# Patient Record
Sex: Female | Born: 1945 | Race: White | Hispanic: No
Health system: Midwestern US, Community
[De-identification: ages and names within clinical notes are randomized; demographics above are authoritative.]

## PROBLEM LIST (undated history)

## (undated) DIAGNOSIS — K219 Gastro-esophageal reflux disease without esophagitis: Secondary | ICD-10-CM

## (undated) DIAGNOSIS — Z0000 Encounter for general adult medical examination without abnormal findings: Secondary | ICD-10-CM

## (undated) DIAGNOSIS — M5441 Lumbago with sciatica, right side: Secondary | ICD-10-CM

## (undated) DIAGNOSIS — M25512 Pain in left shoulder: Secondary | ICD-10-CM

## (undated) DIAGNOSIS — Z79899 Other long term (current) drug therapy: Secondary | ICD-10-CM

## (undated) DIAGNOSIS — G8929 Other chronic pain: Secondary | ICD-10-CM

## (undated) DIAGNOSIS — M5442 Lumbago with sciatica, left side: Secondary | ICD-10-CM

---

## 2016-11-15 ENCOUNTER — Ambulatory Visit: Admit: 2016-11-15 | Discharge: 2016-11-15 | Payer: MEDICARE | Attending: Family | Primary: Family Medicine

## 2016-11-15 DIAGNOSIS — K219 Gastro-esophageal reflux disease without esophagitis: Secondary | ICD-10-CM

## 2016-11-15 MED ORDER — ATORVASTATIN CALCIUM 20 MG PO TABS
20 | ORAL_TABLET | Freq: Every day | ORAL | 3 refills | Status: DC
Start: 2016-11-15 — End: 2017-09-17

## 2016-11-15 MED ORDER — CLONAZEPAM 1 MG PO TABS
1 | ORAL_TABLET | Freq: Every evening | ORAL | 1 refills | Status: DC
Start: 2016-11-15 — End: 2017-06-20

## 2016-11-15 MED ORDER — OMEPRAZOLE 40 MG PO CPDR
40 | ORAL_CAPSULE | Freq: Every day | ORAL | 3 refills | Status: DC
Start: 2016-11-15 — End: 2017-09-17

## 2016-11-15 NOTE — Progress Notes (Signed)
Subjective:      Patient ID: Diane Santos is a 70 y.o. female.    HPI  Refill meds  Had physical in October    Review of Systems  Heart neg  resp neg    Objective:   Physical Exam  General alert and oriented    Assessment:      1. Gastroesophageal reflux disease without esophagitis  omeprazole (PRILOSEC) 40 MG delayed release capsule   2. Pure hypercholesterolemia  atorvastatin (LIPITOR) 20 MG tablet   3. Chronic bilateral low back pain with bilateral sciatica  clonazePAM (KLONOPIN) 1 MG tablet           Plan:      Patient Instructions   Refilled meds; follow up in oct for physical

## 2016-11-15 NOTE — Patient Instructions (Signed)
Refilled meds; follow up in oct for physical

## 2017-05-24 NOTE — Telephone Encounter (Signed)
Pt called to see if we have received all [place]. We do have some previous records scanned into Epic, but pt states that she brought those in , herself. [phone]. Spoke with Dois DavenportSandra who stated that they do not have any release of health information form.   Called back pt. Mailing form to her home, at pt request.

## 2017-06-02 ENCOUNTER — Ambulatory Visit: Admit: 2017-06-02 | Discharge: 2017-06-02 | Payer: MEDICARE | Attending: Family Medicine | Primary: Family Medicine

## 2017-06-02 DIAGNOSIS — J40 Bronchitis, not specified as acute or chronic: Secondary | ICD-10-CM

## 2017-06-02 MED ORDER — DOXYCYCLINE HYCLATE 100 MG PO TABS
100 MG | ORAL_TABLET | Freq: Two times a day (BID) | ORAL | 0 refills | Status: AC
Start: 2017-06-02 — End: 2017-06-12

## 2017-06-02 MED ORDER — BENZONATATE 200 MG PO CAPS
200 MG | ORAL_CAPSULE | Freq: Three times a day (TID) | ORAL | 1 refills | Status: AC | PRN
Start: 2017-06-02 — End: 2017-06-09

## 2017-06-02 NOTE — Progress Notes (Signed)
[facility]    [address]  [address]  [place], [place] [address]  [phone]  ~~~~~~~~~~~~~~~~~~~~~~~~~~~~~~~~~~~~~~~~~~~~~~~~~~~~~~~  ~~~~~~~~~~~~~~~~~~~~~~~~~~~~~~~~~~~~~~~~~~~~~~~~~~~~~~~  ~~~~~~~~~~~~~~~~~~~~~~~~~~~~~~~~~~~~~~~~~~~~~~~~~~~~~~~  Margrett Rud, MD  Family Medicine Office Visit  ~~~~~~~~~~~~~~~~~~~~~~~~~~~~~~~~~~~~~~~~~~~~~~~~~~~~~~~  ~~~~~~~~~~~~~~~~~~~~~~~~~~~~~~~~~~~~~~~~~~~~~~~~~~~~~~~  ~~~~~~~~~~~~~~~~~~~~~~~~~~~~~~~~~~~~~~~~~~~~~~~~~~~~~~~      Chief Complaint   Patient presents with   . Cough     Tickle in throat since Monday , coughing up thick clear phlegm , worse at night when laying down , headache , burning sensation in throat , fatigue , using Advil cold and sinus OTC      =======================    HPI:   General IM:   CONCERNS:  Chief Complaint   Patient presents with   . Cough     Tickle in throat since Monday , coughing up thick clear phlegm , worse at night when laying down , headache , burning sensation in throat , fatigue , using Advil cold and sinus OTC      DAY 6 OF ILLNESS    Some nasal drainage  Cough woke her up at night twice last night  No fevers or chills  No wheezing  Wants to quit smoking but unsure how  Was afraid to  start Chantix previously    Concern about illness due to the fact that she cares for her 7+-year-old mother     Notes that she uses clonazepam nightly from previous doctor in [place]  Due to muscle spasm and back issues  Has not had any back flare since using that    Head computed tomography scan screening of chest about 1 year ago and it was normal  ===================================================      Past data/ past visits reviewed below.    ~~~~~~~~~~~~~~~~~~~~~~~~~~~~~~~~~~~~~~~~~~~~~~~~~~~~~~~  ~~~~~~~~~~~~~~~~~~~~~~~~~~~~~~~~~~~~~~~~~~~~~~~~~~~~~~~  ~~~~~~~~~~~~~~~~~~~~~~~~~~~~~~~~~~~~~~~~~~~~~~~~~~~~~~~  Assessment/ Plan:    ~~~~~~~~~~~~~~~~~~~~~~~~~~~~~~~~~~~~~~~~~~~~~~~~~~~~~~~  ~~~~~~~~~~~~~~~~~~~~~~~~~~~~~~~~~~~~~~~~~~~~~~~~~~~~~~~  ~~~~~~~~~~~~~~~~~~~~~~~~~~~~~~~~~~~~~~~~~~~~~~~~~~~~~~~    Counseled Re: smoking cessation bronchitis antibiotics chronic obstructive pulmonary disease     Diagnosis Orders   1. Bronchitis     2. Tobacco abuse     3. Tobacco abuse counseling         Patient Instructions   For URI: OTC MEDS:      SALINE NASAL SPRAY : USE AS MUCH AS NEEDED TO FLUSH OUT SINUSES   GUIAFENESIN  To loosen mucous  DELSYM to suppress cough :only if needed  Rx Tessalon Perles to help suppress the cough reflex    FILL ANTIBIOTIC : TAKE ALL AND CALL IF NOT IMPROVING  =  Follow-up 3 weeks to repeat lung exam consider chest x-ray  Handouts given:   Bronchitis  Smoking cessation etc.  =   Consider seeing  [facility]  : behavioral health consultant/  in-house psychologist Dr Aldean Jewett   --for stress management / smoking cessation   ==== Unavailable  today (Saturday)   ==  You may schedule a separate appointment or meet with her following a future visit.         Orders Placed This Encounter   Medications   . doxycycline hyclate (VIBRA-TABS) 100 MG tablet     Sig: Take 1 tablet by mouth 2 times daily for 10 days     Dispense:  20 tablet     Refill:  0   . benzonatate (TESSALON) 200 MG capsule     Sig: Take 1 capsule by mouth 3 times daily as needed for Cough     Dispense:  40 capsule     Refill:  1  Return in about 3 weeks (around 06/23/2017).    No orders of the defined types were placed in this encounter.          Problem List     None        Goals     None        ===================================================================     =  There are no discontinued medications.       Medications currently taking:  Outpatient Prescriptions Marked as Taking for the 06/02/17 encounter (Office Visit) with Tod PersiaKristin N Demia Viera, MD   Medication Sig Dispense Refill   . atorvastatin (LIPITOR) 20 MG tablet Take 1 tablet by mouth daily 90 tablet 3   .  clonazePAM (KLONOPIN) 1 MG tablet Take 1 tablet by mouth every evening  For low back pain . 90 tablet 1   . omeprazole (PRILOSEC) 40 MG delayed release capsule Take 1 capsule by mouth daily 90 capsule 3     --        ROS:       SEE HPI  Weight:  Wt Readings from Last 3 Encounters:   06/02/17 130 lb (59 kg)   11/15/16 123 lb (55.8 kg)       Review of Systems        See positives in HPI        Vitals:    06/02/17 1136 06/02/17 1140   BP: (!) 174/80 138/80   Pulse: 63    Temp: 97.7 F (36.5 C)    TempSrc: Oral    SpO2: 99%    Weight: 130 lb (59 kg)    Height: 5\' 4"  (1.626 m)      Body mass index is 22.31 kg/m.  No LMP recorded.    Physical Examination:   GENERAL APPEARANCE: alert and oriented, NAD.   EYES: PERRL, EOMI, no scleral icterus, conjunctiva clear.   NECK/THYROID: visually WNL.   CARDIOVASCULAR: see vitals.   RESPIRATORY: calm.   SKIN: Normal, warm and dry, no rash or skin lesions.   EXTREMITIES: no edema.     NECK/THYROID: supple, non-tender, no lymphadenopathy, no thyromegaly.   CARDIOVASCULAR: regular rate and rhythm, normal S1S2, no murmurs, click or rubs.   RESPIRATORY: no wheezes,  Mild left lower lobe rhonchi,  No rales.     Physical Exam           LAB TESTS REVIEWED ALL PREVIOUS .     No results found for this visit on 06/02/17.    TIME spent : >    20   MINUTES : over 51% total time :    Counselling/coordinating care/ provided discussion as per plan/ patient instructions:  Handouts/ all  reviewed with patient        Portions of note utilize Dragon dictation system:  mild errors may occur.    Assessment and plan at top of note    Tod PersiaKristin N. Cymone Yeske, MD  06/02/17    ~~~~~~~~~~~~~~~~~~~~~~~~~~~~~~~~~~~~~~~~~~~~~~~~~~~~~~~  ~~~~~~~~~~~~~~~~~~~~~~~~~~~~~~~~~~~~~~~~~~~~~~~~~~~~~~~  ~~~~~~~~~~~~~~~~~~~~~~~~~~~~~~~~~~~~~~~~~~~~~~~~~~~~~~~        ===============================================================  CHART REVIEW :   pertinent details  below    ===============================================================  Wt Readings from Last 3 Encounters:   06/02/17 130 lb (59 kg)   11/15/16 123 lb (55.8 kg)       See scanned documents: revd/ pertinent information  copied below    LAST Visit(s) Notes reviewed/ significant notes/ edited version copied below:        [facility]     Provider  Primary Dx   11/15/2016 MichelleTwombly,APRN-CNP Gastroesophageal reflux disease without esophagitis         Health Maintenance   Topic Date Due   . Hepatitis C screen  1946/06/12   . DTaP/Tdap/Td vaccine (1 - Tdap) 12/07/1966   . Lipid screen  12/08/1987   . Breast cancer screen  12/07/1997   . Shingles Vaccine (1 of 2 - 2 Dose Series) 12/07/1997   . Colon cancer screen colonoscopy  12/07/1997   . DEXA (modify frequency per FRAX score)  12/07/2012   . Pneumococcal low/med risk (1 of 2 - PCV13) 12/07/2012   . Flu vaccine (Season Ended) 08/11/2017         Past Medical History:   Diagnosis Date   . Cervical cancer (HCC)    . GERD (gastroesophageal reflux disease)    . Hyperlipidemia    . Lower back pain      Past Surgical History:   Procedure Laterality Date   . CERVICAL DISCECTOMY     . OTHER SURGICAL HISTORY      cone biopsy-cervical ca; pelvic prolapse x2   . ROTATOR CUFF REPAIR Right      Family History   Problem Relation Age of Onset   . Cancer Mother      cervical   . Cancer Sister      esophageal, cervical   . Cancer Other      lung   . Cancer Sister      melanoma     Social History     Social History   . Marital status: Divorced     Spouse name: N/A   . Number of children: N/A   . Years of education: N/A     Social History Main Topics   . Smoking status: Current Every Day Smoker     Packs/day: 1.00   . Smokeless tobacco: Never Used   . Alcohol use None   . Drug use: Unknown   . Sexual activity: Not Asked     Other Topics Concern   . None     Social History Narrative   . None         No Known Allergies      There is no immunization history on  file for this patient.    New meds at discharge:      Medication List          Accurate as of 06/02/17 12:03 PM. If you have any questions, ask your nurse or doctor.               START taking these medications    benzonatate 200 MG capsule  Commonly known as:  TESSALON  Take 1 capsule by mouth 3 times daily as needed for Cough  Started by:  Tod Persia, MD     doxycycline hyclate 100 MG tablet  Commonly known as:  VIBRA-TABS  Take 1 tablet by mouth 2 times daily for 10 days  Started by:  Tod Persia, MD        CONTINUE taking these medications    atorvastatin 20 MG tablet  Commonly known as:  LIPITOR  Take 1 tablet by mouth daily     clonazePAM 1 MG tablet  Commonly known as:  KLONOPIN  Take 1 tablet by mouth every evening  For low back pain .     omeprazole 40 MG delayed release capsule  Commonly known as:  PRILOSEC  Take 1 capsule by mouth daily  Where to Get Your Medications      These medications were sent to [facility] [id number] - [address] [place] - [address] - P [phone] Carmon Ginsberg [phone]  [address], [place] [address]    Phone:  [phone]    benzonatate 200 MG capsule   doxycycline hyclate 100 MG tablet

## 2017-06-02 NOTE — Patient Instructions (Addendum)
For URI: OTC MEDS:      SALINE NASAL SPRAY : USE AS MUCH AS NEEDED TO FLUSH OUT SINUSES   GUIAFENESIN  To loosen mucous  DELSYM to suppress cough :only if needed  Rx Tessalon Perles to help suppress the cough reflex    FILL ANTIBIOTIC : TAKE ALL AND CALL IF NOT IMPROVING  =  Follow-up 3 weeks to repeat lung exam consider chest x-ray  Handouts given:   Bronchitis  Smoking cessation etc.  =   [facility]  [facility]  : [facility]/  in-house psychologist Dr Aldean JewettHrabowy   --for stress management / smoking cessation   ==== Unavailable  today (Saturday)   ==  You may schedule a separate appointment or meet with her following a future visit.

## 2017-06-04 ENCOUNTER — Telehealth

## 2017-06-04 NOTE — Telephone Encounter (Signed)
Same day 06/02/17, NP 11/15/16, pt due for cpe. Please advise pt she is due for cpe and appt re: this medication.

## 2017-06-04 NOTE — Telephone Encounter (Signed)
Left message to call and schedule pe.

## 2017-06-20 ENCOUNTER — Ambulatory Visit: Admit: 2017-06-20 | Discharge: 2017-06-20 | Payer: MEDICARE | Attending: Family | Primary: Family Medicine

## 2017-06-20 DIAGNOSIS — J40 Bronchitis, not specified as acute or chronic: Secondary | ICD-10-CM

## 2017-06-20 MED ORDER — CLONAZEPAM 1 MG PO TABS
1 | ORAL_TABLET | Freq: Every evening | ORAL | 0 refills | Status: DC
Start: 2017-06-20 — End: 2017-09-17

## 2017-06-20 NOTE — Patient Instructions (Addendum)
Zyrtec 1 a day x 2wks at least    Talk with dr Jean Rosenthaljackson about inhaler for daily cough at physical 10/8    Pt given rx for klonopin

## 2017-06-20 NOTE — Progress Notes (Signed)
Subjective:    Patient ID: Diane Santos is a [age] y.o. Gar Pontofemale.    HPI  Saw dr Gerrit Heckkranz 6/23 for bronchitis; took doxycycline and tessalon and saline ns  Doxycyline caused side effect from the sun; took full rx; used aloe and ice  No wheezing anymore; usual cough; no fever; every am coughing up mucus; better with runny nose and stuffy nose and post nasal drip; no ST now  Here to recheck lungs  Has appt with dr j 10/8 for cpe    rf klonopin 1 a day for back pain    Review of Systems  Ms neg except as above  heent neg except as above  resp neg except as above    Objective:   Physical Exam  General alert and oriented  hent-tms pearly gray, pink pharynx, no lymphadenopahty  Heart-rrr, no murmurs  Lungs-cta bilat, no wheezing/crackles/rhonchi noted    Assessment:       Diagnosis Orders   1. Bronchitis      resolving   2. Chronic bilateral low back pain with bilateral sciatica  clonazePAM (KLONOPIN) 1 MG tablet               Plan:      Patient Instructions   Zyrtec 1 a day x 2wks at least    Talk with dr Jean Rosenthaljackson about inhaler for daily cough at physical 10/8    Pt given rx for klonopin

## 2017-09-17 ENCOUNTER — Ambulatory Visit: Admit: 2017-09-17 | Discharge: 2017-09-17 | Payer: MEDICARE | Attending: Family Medicine | Primary: Family Medicine

## 2017-09-17 DIAGNOSIS — Z0000 Encounter for general adult medical examination without abnormal findings: Secondary | ICD-10-CM

## 2017-09-17 MED ORDER — CLONAZEPAM 1 MG PO TABS
1 | ORAL_TABLET | Freq: Every evening | ORAL | 1 refills | Status: DC
Start: 2017-09-17 — End: 2018-04-26

## 2017-09-17 MED ORDER — ZOSTER VAC RECOMB ADJUVANTED 50 MCG/0.5ML IM SUSR
50 MCG/0.5ML | INTRAMUSCULAR | 1 refills | Status: DC
Start: 2017-09-17 — End: 2018-04-26

## 2017-09-17 MED ORDER — TETANUS-DIPHTH-ACELL PERTUSSIS 5-2-15.5 LF-MCG/0.5 IM SUSP
5-2-15.5-0.5 LF-MCG/0.5 | Freq: Once | INTRAMUSCULAR | 0 refills | Status: AC
Start: 2017-09-17 — End: 2017-09-17

## 2017-09-17 MED ORDER — ATORVASTATIN CALCIUM 20 MG PO TABS
20 | ORAL_TABLET | Freq: Every day | ORAL | 4 refills | Status: DC
Start: 2017-09-17 — End: 2018-09-20

## 2017-09-17 MED ORDER — OMEPRAZOLE 40 MG PO CPDR
40 | ORAL_CAPSULE | Freq: Every day | ORAL | 4 refills | Status: DC
Start: 2017-09-17 — End: 2018-09-20

## 2017-09-17 NOTE — Progress Notes (Signed)
Subjective:    Chief Complaint   Patient presents with   . Annual Exam     Would like to be called "Diane Santos", New pt 12/17.  Also sees Derm.  Not fasting, will return for labs.  Reports she had Hep C testing/Dr. Thermon Leyland last year.  Colonoscopy 2015.     . Immunizations     Shingrix, flu, Tdap       New to the area.  Had a colonoscopy and EGD 2016.  Both looked good.  The Hep C was also negative.  Not ready to quit smoking.  Dealing with mother more stressful.  Diane Santos daughter.  Diane Santos's sister.  Lives with Diane Santos and takes care of her.  Chart reviewed.           Review of Systems   Constitutional: Negative for activity change, appetite change and unexpected weight change.   HENT: Negative.    Respiratory: Negative for cough, shortness of breath and wheezing.    Cardiovascular: Negative for leg swelling.   Gastrointestinal: Negative for blood in stool, constipation and diarrhea.   Genitourinary: Negative for dysuria, frequency and hematuria.   Musculoskeletal: Negative for back pain.   Psychiatric/Behavioral: The patient is nervous/anxious.            Social History     Social History   . Marital status: Divorced     Spouse name: N/A   . Number of children: N/A   . Years of education: N/A     Occupational History   . Not on file.     Social History Main Topics   . Smoking status: Current Every Day Smoker     Packs/day: 1.00   . Smokeless tobacco: Never Used   . Alcohol use 8.4 - 12.6 oz/week     14 - 21 Glasses of wine per week   . Drug use: Unknown   . Sexual activity: Not on file     Other Topics Concern   . Not on file     Social History Narrative   . No narrative on file     Past Medical History:   Diagnosis Date   . Cervical cancer (HCC)    . GERD (gastroesophageal reflux disease)    . Hyperlipidemia    . Lower back pain      Past Surgical History:   Procedure Laterality Date   . CERVICAL DISCECTOMY     . OTHER SURGICAL HISTORY      cone biopsy-cervical ca; pelvic prolapse x2   . ROTATOR CUFF REPAIR  Right    . SKIN CANCER EXCISION      BCC     No Known Allergies  Family History   Problem Relation Age of Onset   . Cancer Mother         cervical   . Stroke Mother         Barbaraann Share   . Cancer Sister         esophageal, cervical   . Cancer Other         lung   . Cancer Sister         melanoma       Objective:       Vitals:    09/17/17 0735   BP: 128/76   Pulse: 78   Temp: 98 F (36.7 C)   SpO2: 96%   Weight: 125 lb (56.7 kg)   Height: 5\' 4"  (1.626 m)  Current Outpatient Prescriptions   Medication Sig Dispense Refill   . Tdap (ADACEL) 04-11-14.5 LF-MCG/0.5 injection Inject 0.5 mLs into the muscle once for 1 dose 0.5 mL 0   . zoster recombinant adjuvanted vaccine (SHINGRIX) 50 MCG SUSR injection 50 MCG IM then repeat 2-6 months. 0.5 mL 1   . atorvastatin (LIPITOR) 20 MG tablet Take 1 tablet by mouth daily 90 tablet 4   . clonazePAM (KLONOPIN) 1 MG tablet Take 1 tablet by mouth every evening for 92 days. For low back pain . 90 tablet 1   . omeprazole (PRILOSEC) 40 MG delayed release capsule Take 1 capsule by mouth daily 90 capsule 4   . FINACEA 15 % GEL        No current facility-administered medications for this visit.        Physical Exam   Constitutional: She is oriented to person, place, and time. She appears well-developed and well-nourished.   HENT:   Head: Normocephalic and atraumatic.   Right Ear: External ear normal.   Left Ear: External ear normal.   Mouth/Throat: Oropharynx is clear and moist. No oropharyngeal exudate.   Eyes: Pupils are equal, round, and reactive to light. Conjunctivae and EOM are normal. No scleral icterus.   Neck: Normal range of motion. Neck supple. No JVD present. No thyromegaly present.   Cardiovascular: Normal rate, regular rhythm and normal heart sounds.    No murmur heard.  Pulmonary/Chest: Effort normal and breath sounds normal. She has no wheezes. She exhibits no tenderness.   Abdominal: Soft. Bowel sounds are normal. She exhibits no distension and no mass. There is no  tenderness.   Musculoskeletal: She exhibits no edema.   Lymphadenopathy:     She has no cervical adenopathy.   Neurological: She is alert and oriented to person, place, and time. No cranial nerve deficit.   Skin: Skin is warm. No rash noted.   Psychiatric: She has a normal mood and affect. Her behavior is normal.   Nursing note and vitals reviewed.  Breast:  Normal breast exam bilaterally.  No masses, no nipple discharge, no skin changes, no axillary lymphadenopathy        Assessment:      Diagnosis Orders   1. Routine adult health maintenance  CBC Auto Differential   2. Pure hypercholesterolemia  atorvastatin (LIPITOR) 20 MG tablet    Comprehensive Metabolic Panel    Lipid Panel   3. Chronic bilateral low back pain with bilateral sciatica  clonazePAM (KLONOPIN) 1 MG tablet   4. Gastroesophageal reflux disease without esophagitis  omeprazole (PRILOSEC) 40 MG delayed release capsule   5. Post-menopausal  DEXA Bone Density Axial Skeleton   6. Encounter for screening mammogram for breast cancer  MAM Digital Screen Bilateral [[id number]]   7. Need for prophylactic vaccination against diphtheria-tetanus-pertussis (DTP)  Tdap (ADACEL) 04-11-14.5 LF-MCG/0.5 injection   8. Need for prophylactic vaccination and inoculation against varicella  zoster recombinant adjuvanted vaccine (SHINGRIX) 50 MCG SUSR injection   9. Flu vaccine need  INFLUENZA, HIGH DOSE, 65 YRS +, IM, PF, PREFILL SYR, 0.5ML (FLUZONE HD)         Plan:     Goals     None            Loanne Drilling, MD

## 2017-09-19 ENCOUNTER — Encounter

## 2017-09-19 LAB — CBC WITH AUTO DIFFERENTIAL
Absolute Baso #: 0.1 10*3/uL (ref 0.0–0.2)
Absolute Eos #: 0.1 10*3/uL (ref 0.0–0.5)
Absolute Lymph #: 1.3 10*3/uL (ref 1.0–4.3)
Absolute Mono #: 0.4 10*3/uL (ref 0.0–0.8)
Absolute Neut #: 5.3 10*3/uL (ref 1.8–7.0)
Basophils: 0.9 % (ref 0.0–2.0)
Eosinophils: 0.9 % — ABNORMAL LOW (ref 1.0–6.0)
Granulocytes %: 74 % (ref 40.0–80.0)
Hematocrit: 43.7 % (ref 35.0–47.0)
Hemoglobin: 14.9 g/dL (ref 11.7–16.0)
Lymphocyte %: 17.9 % — ABNORMAL LOW (ref 20.0–40.0)
MCH: 33.6 pg (ref 26.0–34.0)
MCHC: 34.1 % (ref 32.0–36.0)
MCV: 98.5 fL — ABNORMAL HIGH (ref 79.0–98.0)
MPV: 9.7 fL (ref 7.4–10.4)
Monocytes: 6.3 % (ref 2.0–10.0)
Platelets: 188 10*3/uL (ref 140–440)
RBC: 4.43 10*6/uL (ref 3.80–5.20)
RDW: 14.2 % (ref 11.5–14.5)
WBC: 7.1 10*3/uL (ref 3.6–10.7)

## 2017-09-19 LAB — COMPREHENSIVE METABOLIC PANEL
ALT: 31 U/L (ref 13–69)
AST: 30 U/L (ref 15–46)
Albumin,Serum: 4.1 g/dL (ref 3.5–5.0)
Alkaline Phosphatase: 72 U/L (ref 38–126)
Anion Gap: 10 NA
BUN: 13 mg/dL (ref 7–20)
CO2: 25 mmol/L (ref 22–30)
Calcium: 9.6 mg/dL (ref 8.4–10.4)
Chloride: 104 mmol/L (ref 98–107)
Creatinine: 0.76 mg/dL (ref 0.52–1.25)
EGFR IF NonAfrican American: >60 mL/min (ref >60)
Glucose: 105 mg/dL — ABNORMAL HIGH (ref 70–100)
Potassium: 4.9 mmol/L (ref 3.5–5.1)
Sodium: 139 mmol/L (ref 137–145)
Total Bilirubin: 0.7 mg/dL (ref 0.2–1.3)
Total Protein: 6.7 g/dL (ref 6.3–8.2)
eGFR African American: >60 mL/min (ref >60)

## 2017-09-19 LAB — LIPID PANEL
Chol/HDL Ratio: 3 NA
Cholesterol: 171 mg/dL (ref <200)
HDL: 64 mg/dL — ABNORMAL HIGH (ref 40–60)
LDL Cholesterol: 97 mg/dL (ref <100)
Triglycerides: 51 mg/dL (ref <150)

## 2017-10-22 ENCOUNTER — Ambulatory Visit: Primary: Family Medicine

## 2017-11-13 ENCOUNTER — Inpatient Hospital Stay: Admit: 2017-11-13 | Attending: Family Medicine | Primary: Family Medicine

## 2017-11-13 DIAGNOSIS — Z1231 Encounter for screening mammogram for malignant neoplasm of breast: Secondary | ICD-10-CM

## 2017-11-13 NOTE — Other (Unsigned)
Patient Acct Nbr: [id number]   Primary AUTH/CERT:   Primary Insurance Company Name: [employer]  Primary Insurance Plan name: Medicare A  Primary Insurance Group Number:   Primary Insurance Plan Type: Quar[employer] manager[employer]care A  Primary Insurance Policy Number: [id number]    Secondary AUTH/CERT:   Secondary Insurance Company Name: [employer]  Secondary Insurance Plan name: Medicare B  Secondary Insurance Group Number:   Secondary Insurance Plan Type: Journalist, newspaperMcare B  Secondary Insurance Policy Number: [id number]    Tertiary AUTH/CERT:   [employer] Corporationertiary Insurance Company Name: [employer] AARP Supplement  [employer] Br[employer]ertiary Insurance Plan name: [employer] AARP Supplement  [employer]ertiary Insurance Group Number:   [employer] Br[employer]ertiary Insurance Plan Type: [employer]  Tertiary Insurance Policy Number: [id number]

## 2018-02-20 NOTE — Telephone Encounter (Signed)
Spoke with pt advising she can switch pcp's   Pt understands     Transferred her up front to change her PE

## 2018-02-20 NOTE — Telephone Encounter (Signed)
Pt called requesting to switch pcp to dr Gerrit Heckkranz   She normally sees dr Jean RosenthalJackson     Is this ok?

## 2018-02-20 NOTE — Telephone Encounter (Signed)
Ok

## 2018-04-26 ENCOUNTER — Ambulatory Visit: Admit: 2018-04-26 | Discharge: 2018-04-26 | Payer: MEDICARE | Attending: Family Medicine | Primary: Family Medicine

## 2018-04-26 ENCOUNTER — Encounter: Attending: Family | Primary: Family Medicine

## 2018-04-26 DIAGNOSIS — M545 Low back pain, unspecified: Secondary | ICD-10-CM

## 2018-04-26 MED ORDER — CLONAZEPAM 1 MG PO TABS
1 MG | ORAL_TABLET | ORAL | 0 refills | Status: DC
Start: 2018-04-26 — End: 2018-09-20

## 2018-04-26 MED ORDER — AMITRIPTYLINE HCL 10 MG PO TABS
10 MG | ORAL_TABLET | Freq: Every evening | ORAL | 1 refills | Status: DC
Start: 2018-04-26 — End: 2018-09-20

## 2018-04-26 NOTE — Progress Notes (Signed)
[facility]  [address]  [place], [place] [address]  [phone]  ~~~~~~~~~~~~~~~~~~~~~~~~~~~~~~~~~~~~~~~~~~~~~~~~~~~~~~~  ~~~~~~~~~~~~~~~~~~~~~~~~~~~~~~~~~~~~~~~~~~~~~~~~~~~~~~~  ~~~~~~~~~~~~~~~~~~~~~~~~~~~~~~~~~~~~~~~~~~~~~~~~~~~~~~~  Edman Circle, MD  Family Medicine Office Visit  ~~~~~~~~~~~~~~~~~~~~~~~~~~~~~~~~~~~~~~~~~~~~~~~~~~~~~~~  ~~~~~~~~~~~~~~~~~~~~~~~~~~~~~~~~~~~~~~~~~~~~~~~~~~~~~~~  ~~~~~~~~~~~~~~~~~~~~~~~~~~~~~~~~~~~~~~~~~~~~~~~~~~~~~~~      Chief Complaint   Patient presents with   ??? Medication Refill     upcoming pe 09-20-18     ========    Patient Care Team:  Doretha Imus, MD as PCP - General (Family Medicine)  Lorrin Jackson, MD as PCP - Barry Brunner MSSP  ========    HPI:   General IM:   CONCERNS:  Chief Complaint   Patient presents with   ??? Medication Refill     upcoming pe 09-20-18       Originally scheduled to see nurse practitioner but not here today  Prone to herniated disc  Past R sciatica   From ortho in past rx : pain meds  Then to pain mgmt  Other meds tried: approx 5-6 yrs ago  Epidural : helped  ??  High stress at that time: tension was high  \\  qhs med: x yr  Has tried off: ok x 2-3 night  Then after  will have leg cramps  -needs to get up and move and stress  ??  No RLS  ??  Stress is high  Cares for elderly mother  ??  'muscle relaxers don't work'  'never ever'  ??  Does not recall  trying elavil or neurontin  ??  Life is very sendentary      Past data/ past visits reviewed below.  ===========     PHQ Scores 09/17/2017   PHQ2 Score 0   PHQ9 Score 0     Interpretation of Total Score Depression Severity: 1-4 = Minimal depression, 5-9 = Mild depression, 10-14 = Moderate depression, 15-19 = Moderately severe depression, 20-27 = Severe depression    Assessment/ Plan:   ~~~~~~~~~~~~~~~~~~  ~~~~~~~~~~~~~~~~~~  ~~~~~~~~~~~~~~~~~~    Counseled Re: Long discussion about benzodiazepines in the elderly and addiction and tolerance and other modalities for muscle  pain relief muscle spasm release stretching strengthening yoga myofascial release alternative medications  Stress management           Diagnosis Orders   1. Chronic midline low back pain without sciatica     2. Chronic bilateral low back pain with bilateral sciatica  clonazePAM (KLONOPIN) 1 MG tablet   3. Medication management  CBC    Comprehensive Metabolic Panel    Lipid Panel    TSH without Reflex    Vitamin B12    Vitamin D 25 Hydroxy    CK    Magnesium    Phosphorus   4. Situational stress     5. Muscle cramps  CBC    Comprehensive Metabolic Panel    Lipid Panel    TSH without Reflex    Vitamin B12    Vitamin D 25 Hydroxy    CK    Magnesium    Phosphorus   6. Routine adult health maintenance  CBC    Comprehensive Metabolic Panel    Lipid Panel    TSH without Reflex    Vitamin B12    Vitamin D 25 Hydroxy    CK    Magnesium    Phosphorus   7. Pure hypercholesterolemia  Lipid Panel   8. Fatigue, unspecified type  CBC    Comprehensive Metabolic Panel  TSH without Reflex    Vitamin B12    Vitamin D 25 Hydroxy    CK    Magnesium    Phosphorus   9. Vitamin D deficiency  Vitamin D 25 Hydroxy       Patient Instructions   You have been on Klonopin nightly for 5+ years due to muscle spasm  We discussed multiple reasons to get off this medication including aging and metabolism change/ tolerance    Start daily stretching strengthening and possibly yoga  Look into massage and myofascial release  The above things will help you have fewer muscle cramps and spasms    Start Elavil 10 mg at bedtime  --if Completely ineffective after 10+ days okay to increase to 2 tabs nightly     change how you take your Klonopin: [place] this  as needed medication and try half instead of whole tab    Note that the above 2 medications can have additive effects : Especially sedation    Note that as use stop using the Klonopin you can have some rebound effect  Allow your body a chance to adjust    Plan follow-up in 3 months: This may be deferred if  you're able to make the current 90 tablet prescription last until CPE 09/20/18    Get labs in advance of CPE 09/20/2018  "orders in computer" / can get at lab on main floor/ come fasting    FYI we now have available [facility]  : behavioral health consultant/  in-house psychologist Dr Tanna Savoy   --for stress management / anxiety/ depression   ==== You may schedule a separate appointment or meet with her following a future visit    Consider physical therapy or guidance for myofascial release and/or massage  -Let me know if you'd like to do this    Consider finding out past medications so we can know what she tried and failed in the past  -Consider contacting past M.D. or pharmacy    Lipitor can also cause muscle pain try off this medication x 10 days then retry!!  I got 1 more piece of paper,      Orders Placed This Encounter   Medications   ??? clonazePAM (KLONOPIN) 1 MG tablet     Sig: 1/2- 1 tab q.h.s. p.r.n. muscle cramps insomnia ; APPOINTMENT NECESSARY FOR ALL CONTROLLED SUBSTANCE REFILLS     Dispense:  90 tablet     Refill:  0   ??? amitriptyline (ELAVIL) 10 MG tablet     Sig: Take 1 tablet by mouth nightly     Dispense:  90 tablet     Refill:  1         Return in about 3 months (around 07/27/2018).    Orders Placed This Encounter   Procedures   ??? CBC     Standing Status:   Future     Standing Expiration Date:   04/27/2019   ??? Comprehensive Metabolic Panel     Standing Status:   Future     Standing Expiration Date:   04/27/2019   ??? Lipid Panel     Standing Status:   Future     Standing Expiration Date:   04/27/2019     Order Specific Question:   Is Patient Fasting?/# of Hours     Answer:   8   ??? TSH without Reflex     Standing Status:   Future     Standing Expiration Date:   04/27/2019   ???  Vitamin B12     Standing Status:   Future     Standing Expiration Date:   04/27/2019   ??? Vitamin D 25 Hydroxy     Standing Status:   Future     Standing Expiration Date:   04/27/2019   ??? CK     Standing Status:   Future     Standing Expiration  Date:   04/26/2019   ??? Magnesium     Standing Status:   Future     Standing Expiration Date:   04/27/2019   ??? Phosphorus     Standing Status:   Future     Standing Expiration Date:   04/27/2019           Problem List     None        Goals     None        ====================    =  Medications Discontinued During This Encounter   Medication Reason   ??? zoster recombinant adjuvanted vaccine (SHINGRIX) 50 MCG SUSR injection LIST CLEANUP   ??? clonazePAM (KLONOPIN) 1 MG tablet REORDER          Medications currently taking:  Outpatient Medications Marked as Taking for the 04/26/18 encounter (Office Visit) with Doretha Imus, MD   Medication Sig Dispense Refill   ??? clonazePAM (KLONOPIN) 1 MG tablet 1 tab q.h.s.  90 tablet 0   ???   90 tablet 1   ??? atorvastatin (LIPITOR) 20 MG tablet Take 1 tablet by mouth daily 90 tablet 4   ??? omeprazole (PRILOSEC) 40 MG delayed release capsule Take 1 capsule by mouth daily 90 capsule 4   ??? FINACEA 15 % GEL        --      ROS:       SEE HPI  Weight:  Wt Readings from Last 3 Encounters:   04/26/18 134 lb (60.8 kg)   09/17/17 125 lb (56.7 kg)   06/20/17 126 lb (57.2 kg)       Review of Systems         See positives in HPI        Vitals:    04/26/18 1039   BP: (!) 144/82   Pulse: 72   SpO2: 97%   Weight: 134 lb (60.8 kg)     Body mass index is 23 kg/m??.  No LMP recorded. Patient is postmenopausal.    Physical Examination:   GENERAL APPEARANCE: alert and oriented, NAD.   EYES: PERRL, EOMI, no scleral icterus, conjunctiva clear.   NECK/THYROID: visually WNL.   CARDIOVASCULAR: see vitals.   RESPIRATORY: calm.   SKIN: Normal, warm and dry, no rash or skin lesions.   EXTREMITIES: no edema.       Physical Exam           LAB TESTS REVIEWED ALL PREVIOUS .     No results found for this visit on 04/26/18.    TIME spent : >    30   MINUTES : over 51% total time :    Counselling/coordinating care/ provided discussion as per plan/ patient instructions:  Handouts/ all  reviewed with patient      Controlled  Substances Monitoring:     RX Monitoring 04/26/2018   Attestation The Prescription Monitoring Report for this patient was reviewed today.   Chronic Pain Routine Monitoring Possible medication side effects, risk of tolerance/dependence & alternative treatments discussed.   Chronic Pain > 50 MEDD Considered consultation  with a specialist.   Chronic Pain > 80 MEDD Obtained or confirmed a written medication contract was on file.       Portions of note utilize Dragon dictation system:  mild errors may occur.    Assessment and plan at top of note    Doretha Imus, MD  04/26/18    ~~~~~~~~~~~~~~~~~~~~~~~~~~~~~~~~~~~~~~~~~~~~~~~~~~~~~~~  ~~~~~~~~~~~~~~~~~~~~~~~~~~~~~~~~~~~~~~~~~~~~~~~~~~~~~~~  ~~~~~~~~~~~~~~~~~~~~~~~~~~~~~~~~~~~~~~~~~~~~~~~~~~~~~~~        ===============================================================  CHART REVIEW :   pertinent details below/ edited / partial copy/paste data:     **  Component      Latest Ref Rng & Units 09/19/2017 09/19/2017 09/19/2017           9:49 AM  9:49 AM  9:49 AM   WBC      3.6 - 10.7 10*3/uL   7.1   RBC      3.80 - 5.20 10*6/uL   4.43   Hemoglobin Quant      11.7 - 16.0 g/dL   14.9   Hematocrit      35.0 - 47.0 %   43.7   MCV      79.0 - 98.0 fL   98.5 (H)   MCH      26.0 - 34.0 pg   33.6   MCHC      32.0 - 36.0 %   34.1   RDW      11.5 - 14.5 %   14.2   Platelet Count      140 - 440 10*3/uL   188   MPV      7.4 - 10.4 fL   9.7   Granulocytes %      40.0 - 80.0 %   74.0   Lymphocyte %      20.0 - 40.0 %   17.9 (L)   Monocytes      2.0 - 10.0 %   6.3   Eosinophils      1.0 - 6.0 %   0.9 (L)   Basophils      0.0 - 2.0 %   0.9   Absolute Neut #      1.8 - 7.0 10*3/uL   5.3   Absolute Lymph #      1.0 - 4.3 10*3/uL   1.3   Absolute Mono #      0.0 - 0.8 10*3/uL   0.4   Absolute Eos #      0.0 - 0.5 10*3/uL   0.1   Basophils #      0.0 - 0.2 10*3/uL   0.1   Sodium      137 - 145 mmol/L  139    Potassium      3.5 - 5.1 mmol/L  4.9    Chloride      98 - 107 mmol/L  104    CO2      22 -  30 mmol/L  25    Anion Gap      NA  10    Glucose      70 - 100 mg/dL  105 (H)    BUN      7 - 20 mg/dL  13    Creatinine      0.52 - 1.25 mg/dL  0.76    eGFR African American      >60 mL/min  >60.0    EGFR IF NonAfrican American      >60 mL/min  >60.0    Calcium  8.4 - 10.4 mg/dL  9.6    Albumin      3.5 - 5.0 g/dL  4.1    Total Protein      6.3 - 8.2 g/dL  6.7    Bilirubin      0.2 - 1.3 mg/dL  0.7    Alk Phos      38 - 126 U/L  72    ALT      13 - 69 U/L  31    AST      15 - 46 U/L  30    Cholesterol      <200 mg/dL 171     Triglycerides      <150 mg/dL 51     HDL Cholesterol      40 - 60 mg/dL 64 (H)     LDL Cholesterol      <100 mg/dL 97     Chol/HDL Ratio      NA 3         ===============================================================  Wt Readings from Last 3 Encounters:   04/26/18 134 lb (60.8 kg)   09/17/17 125 lb (56.7 kg)   06/20/17 126 lb (57.2 kg)       See scanned documents: revd/ pertinent information  copied below    LAST Visit(s) Notes reviewed/ significant notes/ edited version copied below:     Recent Visits with Me     Primary Dx    06/02/2017 Bronchitis  Patient presents with   ??? Cough   ?? ?? Tickle in throat since Monday , coughing up thick clear phlegm , worse at night when laying down , headache , burning sensation in throat , fatigue , using Advil cold and sinus OTC    ??  DAY 6 OF ILLNESS  ??  Some nasal drainage  Cough woke her up at night twice last night  No fevers or chills  No wheezing  Wants to quit smoking but unsure how  Was afraid to  start Chantix previously  ??  Concern about illness due to the fact that she cares for her 28+-year-old mother   ??  Notes that she uses clonazepam nightly from previous doctor in [place]  Due to muscle spasm and back issues  Has not had any back flare since using that  ??  Head computed tomography scan screening of chest about 1 year ago and it was normal  ===================================================  ??  ??    Assessment/ Plan:    ~~~~~~~~~~~~~~~~~~~~~~~~~~~~~~~~~~~~~~~~~~~~~~~~~~~~~~~  ~~~~~~~~~~~~~~~~~~~~~~~~~~~~~~~~~~~~~~~~~~~~~~~~~~~~~~~  ~~~~~~~~~~~~~~~~~~~~~~~~~~~~~~~~~~~~~~~~~~~~~~~~~~~~~~~  ??  Counseled Re: smoking cessation bronchitis antibiotics chronic obstructive pulmonary disease  ??  ?? Diagnosis Orders   1. Bronchitis  ??   2. Tobacco abuse  ??   3. Tobacco abuse counseling  ??   ??  ??  Patient Instructions   For URI: OTC MEDS:      SALINE NASAL SPRAY : USE AS MUCH AS NEEDED TO FLUSH OUT SINUSES   GUIAFENESIN  To loosen mucous  DELSYM to suppress cough :only if needed  Rx Tessalon Perles to help suppress the cough reflex    FILL ANTIBIOTIC : TAKE ALL AND CALL IF NOT IMPROVING  =  Follow-up 3 weeks to repeat lung exam consider chest x-ray  Handouts given:   Bronchitis  Smoking cessation etc.  =   Consider seeing  [facility]  : behavioral health consultant/  in-house psychologist Dr Tanna Savoy   --for stress management / smoking cessation   ==== Unavailable  today (Saturday)   ==  You may schedule a separate appointment or meet with her following a future visit.   ??  ??  ??  Encounter Medications         Orders Placed This Encounter   Medications   ??? doxycycline hyclate (VIBRA-TABS) 100 MG tablet   ?? ?? Sig: Take 1 tablet by mouth 2 times daily for 10 days   ?? ?? Dispense:  20 tablet   ?? ?? Refill:  0   ??? benzonatate (TESSALON) 200 MG capsule   ?? ?? Sig: Take 1 capsule by mouth 3 times daily as needed for Cough   ?? ?? Dispense:  40 capsule   ?? ?? Refill:  1      ??  ??  ??  Return in about 3 weeks (around 06/23/2017).  ??     Other Visits in Family Medicine     Provider Primary Dx   09/17/2017 Lorrin Jackson, MD Routine adult health maintenance  Chief Complaint   Patient presents with   ??? Annual Exam   ?? ?? Would like to be called "Jerrye Beavers", New pt 12/17.  Also sees Derm.  Not fasting, will return for labs.  Reports she had Hep C testing/Dr. Virgel Bouquet last year.  Colonoscopy 2015.     ??? Immunizations   ?? ?? Shingrix, flu, Tdap   ??  ??  New to the area.       06/20/2017 Ernestene Kiel, APRN - CNP Bronchitis   11/15/2016 Ernestene Kiel, APRN - CNP Gastroesophageal reflux disease without esophagitis         Health Maintenance   Topic Date Due   ??? DTaP/Tdap/Td vaccine (1 - Tdap) 12/07/1966   ??? Shingles Vaccine (1 of 2) 12/07/1997   ??? DEXA (modify frequency per FRAX score)  12/07/2012   ??? Breast cancer screen  11/14/2019   ??? Lipid screen  09/19/2022   ??? Colon cancer screen colonoscopy  04/20/2024   ??? Flu vaccine  Completed   ??? Pneumococcal 65+ years Vaccine  Completed   ??? Hepatitis C screen  Completed         Past Medical History:   Diagnosis Date   ??? Cervical cancer ([place])    ??? GERD (gastroesophageal reflux disease)    ??? Hyperlipidemia    ??? Lower back pain      Past Surgical History:   Procedure Laterality Date   ??? CERVICAL DISCECTOMY     ??? OTHER SURGICAL HISTORY      cone biopsy-cervical ca; pelvic prolapse x2   ??? ROTATOR CUFF REPAIR Right    ??? SKIN CANCER EXCISION      BCC     Family History   Problem Relation Age of Onset   ??? Cancer Mother         cervical   ??? Stroke Mother         TIA's   ??? Cancer Sister         esophageal, cervical   ??? Cancer Other         lung   ??? Cancer Sister         melanoma     Social History     Socioeconomic History   ??? Marital status: Divorced     Spouse name: None   ??? Number of children: None   ??? Years of education: None   ??? Highest education level: None   Occupational History   ??? None   Social Needs   ???  Financial resource strain: None   ??? Food insecurity:     Worry: None     Inability: None   ??? Transportation needs:     Medical: None     Non-medical: None   Tobacco Use   ??? Smoking status: Current Every Day Smoker     Packs/day: 1.00   ??? Smokeless tobacco: Never Used   Substance and Sexual Activity   ??? Alcohol use: Yes     Alcohol/week: 8.4 - 12.6 oz     Types: 14 - 21 Glasses of wine per week   ??? Drug use: None   ??? Sexual activity: None   Lifestyle   ??? Physical activity:     Days per week: None     Minutes per session: None   ??? Stress:  None   Relationships   ??? Social connections:     Talks on phone: None     Gets together: None     Attends religious service: None     Active member of club or organization: None     Attends meetings of clubs or organizations: None     Relationship status: None   ??? Intimate partner violence:     Fear of current or ex partner: None     Emotionally abused: None     Physically abused: None     Forced sexual activity: None   Other Topics Concern   ??? None   Social History Narrative   ??? None         No Known Allergies    Immunization History   Administered Date(s) Administered   ??? Influenza, High Dose (Fluzone 65 yrs and older) 09/17/2017   ??? Pneumococcal 13-valent Conjugate (Prevnar13) 05/31/2015   ??? Pneumococcal Polysaccharide (Pneumovax23) 05/25/2014       New meds at discharge:      Medication List           Accurate as of 04/26/18 11:18 AM. If you have any questions, ask your nurse or doctor.               START taking these medications    amitriptyline 10 MG tablet  Commonly known as:  ELAVIL  Take 1 tablet by mouth nightly  Started by:  Doretha Imus, MD        CHANGE how you take these medications    clonazePAM 1 MG tablet  Commonly known as:  KLONOPIN  1/2- 1 tab q.h.s. p.r.n. muscle cramps insomnia ; APPOINTMENT NECESSARY FOR ALL CONTROLLED SUBSTANCE REFILLS  What changed:    ?? how much to take  ?? how to take this  ?? when to take this  ?? additional instructions  Changed by:  Doretha Imus, MD        CONTINUE taking these medications    atorvastatin 20 MG tablet  Commonly known as:  LIPITOR  Take 1 tablet by mouth daily     FINACEA 15 % Gel  Generic drug:  Azelaic Acid     omeprazole 40 MG delayed release capsule  Commonly known as:  PRILOSEC  Take 1 capsule by mouth daily           Where to Get Your Medications      These medications were sent to [facility], [address]  [place], [place] [address]    Phone:  [phone]    ?? amitriptyline  10 MG tablet     You can get these medications from any pharmacy    Bring a paper prescription for each of these medications  ?? clonazePAM 1 MG tablet

## 2018-04-26 NOTE — Patient Instructions (Addendum)
You have been on Klonopin nightly for 5+ years due to muscle spasm  We discussed multiple reasons to get off this medication including aging and metabolism change/ tolerance    Start daily stretching strengthening and possibly yoga  Look into massage and myofascial release  The above things will help you have fewer muscle cramps and spasms    Start Elavil 10 mg at bedtime  --if Completely ineffective after 10+ days okay to increase to 2 tabs nightly     change how you take your Klonopin: [place] this  as needed medication and try half instead of whole tab    Note that the above 2 medications can have additive effects : Especially sedation    Note that as use stop using the Klonopin you can have some rebound effect  Allow your body a chance to adjust    Plan follow-up in 3 months: This may be deferred if you're able to make the current 90 tablet prescription last until CPE 09/20/18    Get labs in advance of CPE 09/20/2018  "orders in computer" / can get at lab on main floor/ come fasting    FYI we now have available [facility]  : behavioral health consultant/  in-house psychologist Dr Aldean Jewett   --for stress management / anxiety/ depression   ==== You may schedule a separate appointment or meet with her following a future visit    Consider physical therapy or guidance for myofascial release and/or massage  -Let me know if you'd like to do this    Consider finding out past medications so we can know what she tried and failed in the past  -Consider contacting past M.D. or pharmacy    Lipitor can also cause muscle pain try off this medication x 10 days then retry!!  I got 1 more piece of paper,

## 2018-04-26 NOTE — Progress Notes (Signed)
Prone to herniated disc  Past R sciatica   From ortho in past rx : pain meds  Then to pain mgmt  Other meds tried: approx 5-6 yrs ago  Epidural : helped    High stress at that time: tension was high  \  qhs med: x yr  Has tried off: ok x 2-3 night  Then after  will have leg cramps  -needs to get up and move and stress    No RLS    Stress is high  Cares for elderly mother    'muscle relaxers don't work'  'never ever'    Does not recall  trying elavil or neurontin    Life is very sendentary

## 2018-08-02 ENCOUNTER — Encounter: Attending: Family Medicine | Primary: Family Medicine

## 2018-09-16 ENCOUNTER — Encounter

## 2018-09-16 LAB — COMPREHENSIVE METABOLIC PANEL
ALT: 23 U/L (ref 13–69)
AST: 29 U/L (ref 15–46)
Albumin,Serum: 4.1 g/dL (ref 3.5–5.0)
Alkaline Phosphatase: 62 U/L (ref 38–126)
Anion Gap: 5 NA
BUN: 14 mg/dL (ref 7–20)
CO2: 27 mmol/L (ref 22–30)
Calcium: 9.1 mg/dL (ref 8.4–10.4)
Chloride: 104 mmol/L (ref 98–107)
Creatinine: 0.69 mg/dL (ref 0.52–1.25)
Glucose: 105 mg/dL — ABNORMAL HIGH (ref 70–100)
Potassium: 4.3 mmol/L (ref 3.5–5.1)
Sodium: 137 mmol/L (ref 135–145)
Total Bilirubin: 0.6 mg/dL (ref 0.2–1.3)
Total Protein: 6.8 g/dL (ref 6.3–8.2)
eGFR AA: >60 mL/min (ref >60)
eGFR NON-AA: >60 mL/min (ref >60)

## 2018-09-16 LAB — CBC
Hematocrit: 42.2 % (ref 35.0–47.0)
Hemoglobin: 14.4 g/dL (ref 11.7–16.0)
MCH: 34.6 pg — ABNORMAL HIGH (ref 26.0–34.0)
MCHC: 34.1 % (ref 32.0–36.0)
MCV: 101.4 fL — ABNORMAL HIGH (ref 79.0–98.0)
MPV: 8.9 fL (ref 7.4–10.4)
Platelets: 192 10*3/uL (ref 140–440)
RBC: 4.16 10*6/uL (ref 3.80–5.20)
RDW: 13.5 % (ref 11.5–14.5)
WBC: 6.4 10*3/uL (ref 3.6–10.7)

## 2018-09-16 LAB — VITAMIN D 25 HYDROXY: Vit D, 25-Hydroxy: 14 ng/mL — ABNORMAL LOW (ref 30–100)

## 2018-09-16 LAB — LIPID PANEL
Chol/HDL Ratio: 3 NA
Cholesterol: 211 mg/dL — AB (ref <200)
HDL: 66 mg/dL — ABNORMAL HIGH (ref 40–60)
LDL Cholesterol: 119 mg/dL — AB (ref <100)
Triglycerides: 128 mg/dL (ref <150)

## 2018-09-16 LAB — VITAMIN B12: Vitamin B-12: 403 pg/mL (ref 239–931)

## 2018-09-16 LAB — MAGNESIUM: Magnesium: 2.1 mg/dL (ref 1.6–2.3)

## 2018-09-16 LAB — PHOSPHORUS: Phosphorus: 3.8 mg/dL (ref 2.5–4.5)

## 2018-09-16 LAB — TSH: TSH: 2.457 u[IU]/mL (ref 0.465–4.680)

## 2018-09-16 LAB — CK: Total CK: 78 U/L (ref 30–170)

## 2018-09-20 ENCOUNTER — Ambulatory Visit: Admit: 2018-09-20 | Discharge: 2018-09-20 | Payer: MEDICARE | Attending: Family Medicine | Primary: Family Medicine

## 2018-09-20 ENCOUNTER — Encounter: Attending: Family Medicine | Primary: Family Medicine

## 2018-09-20 DIAGNOSIS — Z0000 Encounter for general adult medical examination without abnormal findings: Secondary | ICD-10-CM

## 2018-09-20 LAB — POCT GLYCOSYLATED HEMOGLOBIN (HGB A1C): Hemoglobin A1C: 5 %

## 2018-09-20 MED ORDER — CLONAZEPAM 1 MG PO TABS
1 MG | ORAL_TABLET | ORAL | 0 refills | Status: DC
Start: 2018-09-20 — End: 2019-10-21

## 2018-09-20 MED ORDER — ATORVASTATIN CALCIUM 20 MG PO TABS
20 MG | ORAL_TABLET | Freq: Every day | ORAL | 4 refills | Status: DC
Start: 2018-09-20 — End: 2019-10-21

## 2018-09-20 MED ORDER — ERGOCALCIFEROL 1.25 MG (50000 UT) PO CAPS
1.25 MG (50000 UT) | ORAL_CAPSULE | ORAL | 5 refills | Status: DC
Start: 2018-09-20 — End: 2019-10-21

## 2018-09-20 MED ORDER — ZOSTER VAC RECOMB ADJUVANTED 50 MCG/0.5ML IM SUSR
50 MCG/0.5ML | Freq: Once | INTRAMUSCULAR | 1 refills | Status: AC
Start: 2018-09-20 — End: 2018-09-20

## 2018-09-20 MED ORDER — OMEPRAZOLE 40 MG PO CPDR
40 MG | ORAL_CAPSULE | Freq: Every day | ORAL | 4 refills | Status: DC
Start: 2018-09-20 — End: 2019-10-21

## 2018-09-20 NOTE — Progress Notes (Signed)
~~~~~~~~~~~~~~~~~~~~~~~~~~~~~~~~~~~~~~~~~~~~~~~~~~~~~~~  ~~~~~~~~~~~~~~~~~~~~~~~~~~~~~~~~~~~~~~~~~~~~~~~~~~~~~~~  ~~~~~~~~~~~~~~~~~~~~~~~~~~~~~~~~~~~~~~~~~~~~~~~~~~~~~~~  [facility]    [address]  [address]  [place], [place] [address]  [phone]  ~~~~~~~~~~~~~~~~~~~~~~~~~~~~~~~~~~~~~~~~~~~~~~~~~~~~~~~  ~~~~~~~~~~~~~~~~~~~~~~~~~~~~~~~~~~~~~~~~~~~~~~~~~~~~~~~  ~~~~~~~~~~~~~~~~~~~~~~~~~~~~~~~~~~~~~~~~~~~~~~~~~~~~~~~  Margrett Rud, MD  Family Medicine Office Visit  ~~~~~~~~~~~~~~~~~~~~~~~~~~~~~~~~~~~~~~~~~~~~~~~~~~~~~~~  ~~~~~~~~~~~~~~~~~~~~~~~~~~~~~~~~~~~~~~~~~~~~~~~~~~~~~~~  ~~~~~~~~~~~~~~~~~~~~~~~~~~~~~~~~~~~~~~~~~~~~~~~~~~~~~~~      Chief Complaint   Patient presents with   ??? Medicare AWV     pt requesting flu vaccine     ========    Patient Care Team:  Tod Persia, MD as PCP - General (Family Medicine)  ========    HPI:   General IM:   CONCERNS:  Chief Complaint   Patient presents with   ??? Medicare AWV     pt requesting flu vaccine       Still very stressed with issues with caring for aging mother who lives in  She is worsening  Has limited time for herself  Has not really started doing stretching  Try Elavil times when she felt very out of it    Reviewed labs in detail    Reviewed past Pap smear history in detail  Recent labs reviewed of significance vitamin D 14 glucose 105  Lipid panel [id number]    Past data/ past visits reviewed below.  ===========   *past cervical IO:NGEX biopsy ~1980S;  ; past inadequate Paps w gyn x 5; Vag hyst (ovaries intact) due to prolapase / neg HPV not done / next pap due now: pt requests defer: plan 09/2019  ===========  PHQ Scores 09/20/2018 09/17/2017   PHQ2 Score 0 0   PHQ9 Score 0 0     Interpretation of Total Score Depression Severity: 1-4 = Minimal depression, 5-9 = Mild depression, 10-14 = Moderate depression, 15-19 = Moderately severe depression, 20-27 = Severe depression    Assessment/ Plan:    ~~~~~~~~~~~~~~~~~~  ~~~~~~~~~~~~~~~~~~  ~~~~~~~~~~~~~~~~~~    Counseled Re: Issues relating to Pap smear stretching strengthening muscles lab results vitamin D deficiency osteoporosis medication options     Diagnosis Orders   1. Routine general medical examination at a health care facility     2. Pure hypercholesterolemia  atorvastatin (LIPITOR) 20 MG tablet    Lipid Panel    ALT   3. Gastroesophageal reflux disease without esophagitis  omeprazole (PRILOSEC) 40 MG delayed release capsule   4. Chronic bilateral low back pain with bilateral sciatica  clonazePAM (KLONOPIN) 1 MG tablet   5. Screening for breast cancer  MAM DIGITAL SCREEN W CAD BILATERAL   6. Screening for osteoporosis  DEXA Bone Density Axial Skeleton   7. Postmenopausal bone loss  DEXA Bone Density Axial Skeleton   8. Encounter for screening mammogram for malignant neoplasm of breast   MAM DIGITAL SCREEN W CAD BILATERAL   9. Irregular heart rhythm  EKG 12 Lead   10. Restless leg syndrome     11. Muscle cramps  clonazePAM (KLONOPIN) 1 MG tablet   12. Medication management  Vitamin D 25 Hydroxy    Lipid Panel    ALT    Hemoglobin A1C   13. Vitamin D deficiency  Vitamin D 25 Hydroxy   14. IFG (impaired fasting glucose)  Hemoglobin A1C    POCT glycosylated hemoglobin (Hb A1C)       Patient instructions:   Patient Instructions      Start vitamin D prescription: 3 times a week ??2 months then once weekly indefinitely    Fasting labs:  Done previously    Due to medications:  Fasting labs in 6 months/ optional appointment to discuss/"orders in computer" / can get at lab on main floor    Mammograms :yearly : Order placed for DEC   Bone density test : ordered /  :you would like to defer this for now: prior to next appointment so we can review    Continue efforts for muscle health  Increase stretching    Decrease alcohol use : as this can dehydrate you and increase leg cramps  This also increases your risk for osteoporosis    Rx Klonapin: continue efforts to wean  this  Rx #90 : try to make this labs 6-12 m    Recommend vaginal pap: due to past abn: plan for this next yr per your request  =======GENERAL INFO========================================  Move!  Be active!  Exercise!  Exercise should include: Aerobic, strength, flexibility/stretching.  Flexibility and stretching can include massage and gentle stretching and myofascial release modalities.    Monitor and limit "screen time"     Consider a once daily multivitamin  Additional supplements as needed:  CALCIUM recommend at least  1500 MG/D  VITAMIN D 2000-3000 U / D   FISH OIL 2-4GM/D (especially if heart risk or high cholesterol)     Vaccine update:   Immunization History   Administered Date(s) Administered   ??? Influenza, High Dose (Fluzone 71 yrs and older) 09/17/2017   ??? Pneumococcal Conjugate 13-valent (Prevnar13) 05/31/2015   ??? Pneumococcal Polysaccharide (Pneumovax23) 05/25/2014   ??? Tdap (Boostrix, Adacel) 11/23/2017        -Tetanus BOOSTER DUE 10 YEARS AFTER last booster (Td or Tdap) OR IF DIRTY CUT AND >71YRS SINCE  Last booster     NEW shingle vaccine  AGE 71 + :  Shingrix / series of 2 vaccines that give 90+ % protection  -Better than past Zostavax shingles vaccine  --- if needed : Rx will be given:   verify insurance coverage and where to get Pharmacy or MD office  INFLUENZA VACCINE EVERY FALL/  COLONOSCOPY starting age 71- 27 or sooner if family history    SET GOALS! DAILY! WEEKLY! AND BEYOND  -START YOUR BUCKET LIST!  FIND YOUR HAPPY! YOUR BLISS! YOUR PASSION!  HOW CAN WE HELP YOU BE HEALTHIER TO ACHIEVE YOUR GOALS??  HEALTHY DIET!! ENCOURAGE LOW FAT/LOW CHOLESTEROL!  FRESH FRUITS/ VEGGIES! AVOID PROCESSED FOODS!  COOK FOR YOURSELF!!.    FYI we now have available [facility]  : behavioral health consultant/  in-house psychologist Dr Aldean Jewett   --for stress management / anxiety/ depression or other psychological health needs  ==== You may schedule a separate appointment or meet with her following a future visit.      Handouts given:   Wellness    fall prevention  Heart healthy diet  Exercise general  vitamin D  Prediabetes    Today's and previously stated goals:   Goals    None         Personalized Preventive Plan for Aviyanna Colbaugh - 09/20/2018  Medicare offers a range of preventive health benefits. Some of the tests and screenings are paid in full while other may be subject to a deductible, co-insurance, and/or copay.    Some of these benefits include a comprehensive review of your medical history including lifestyle, illnesses that may run in your family, and various assessments and screenings as appropriate.    After reviewing your medical record and screening and assessments performed today your provider may have ordered immunizations, labs, imaging, and/or referrals for you.  A list of these orders (if applicable) as well as your Preventive Care list are included within your After Visit Summary for your review.    Other Preventive Recommendations:    ?? A preventive eye exam performed by an eye specialist is recommended every 1-2 years to screen for glaucoma; cataracts, macular degeneration, and other eye disorders.  ?? A preventive dental visit is recommended every 6 months.  ?? Try to get at least 150 minutes of exercise per week or 10,000 steps per day on a pedometer .  ?? Order or download the FREE "Exercise & Physical Activity: Your Everyday Guide" from [employer]. Call 707-804-3399 or search [employer] online.  ?? You need 1200-1500 mg of calcium and 1000-2000 IU of vitamin D per day. It is possible to meet your calcium requirement with diet alone, but a vitamin D supplement is usually necessary to meet this goal.  ?? When exposed to the sun, use a sunscreen that protects against both UVA and UVB radiation with an SPF of 30 or greater. Reapply every 2 to 3 hours or after sweating, drying off with a towel, or swimming.  ?? Always wear a seat belt when traveling in a car. Always  wear a helmet when riding a bicycle or motorcycle.      Orders Placed This Encounter   Medications   ??? atorvastatin (LIPITOR) 20 MG tablet     Sig: Take 1 tablet by mouth daily     Dispense:  90 tablet     Refill:  4   ??? omeprazole (PRILOSEC) 40 MG delayed release capsule     Sig: Take 1 capsule by mouth daily     Dispense:  90 capsule     Refill:  4   ??? clonazePAM (KLONOPIN) 1 MG tablet     Sig: 1/2- 1 tab q.h.s. p.r.n. muscle cramps insomnia ; APPOINTMENT NECESSARY FOR ALL CONTROLLED SUBSTANCE REFILLS     Dispense:  90 tablet     Refill:  0   ??? zoster recombinant adjuvanted vaccine (SHINGRIX) 50 MCG/0.5ML SUSR injection     Sig: Inject 0.5 mLs into the muscle once for 1 dose second dose: Usually 2-6 months after first     Dispense:  0.5 mL     Refill:  1   ??? ergocalciferol (ERGOCALCIFEROL) 50000 units capsule     Sig: 1 capsule Monday Wednesday Friday x 2 months then 1 cap weekly     Dispense:  16 capsule     Refill:  5         Return in 6 months (on 03/22/2019) for 6 m fup/ Medicare Annual Wellness Visit in 1 year.    Orders Placed This Encounter   Procedures   ??? MAM DIGITAL SCREEN W CAD BILATERAL     Standing Status:   Future     Standing Expiration Date:   11/21/2019     Order Specific Question:   Reason for exam:     Answer:   screening   ??? DEXA Bone Density Axial Skeleton     Standing Status:   Future     Standing Expiration Date:   03/21/2020   ??? INFLUENZA, HIGH DOSE, 65 YRS +, IM, PF, PREFILL SYR, 0.5ML (FLUZONE HD)   ??? Vitamin D 25 Hydroxy     Standing Status:   Future     Standing Expiration Date:   09/21/2019   ??? Lipid Panel  Standing Status:   Future     Standing Expiration Date:   09/20/2019     Order Specific Question:   Is Patient Fasting?/# of Hours     Answer:   yes/ 8   ??? ALT     Standing Status:   Future     Standing Expiration Date:   09/21/2019   ??? Hemoglobin A1C     Standing Status:   Future     Standing Expiration Date:   09/21/2019   ??? POCT glycosylated hemoglobin (Hb A1C)   ??? EKG 12 Lead      Order Specific Question:   Reason for Exam?     Answer:   Irregular heart rate           Problem List     None        Goals    None       ====================    =  Medications Discontinued During This Encounter   Medication Reason   ??? amitriptyline (ELAVIL) 10 MG tablet LIST CLEANUP   ??? atorvastatin (LIPITOR) 20 MG tablet REORDER   ??? omeprazole (PRILOSEC) 40 MG delayed release capsule REORDER   ??? clonazePAM (KLONOPIN) 1 MG tablet REORDER       **Medications currently taking:  Outpatient Medications Marked as Taking for the 09/20/18 encounter (Office Visit) with Tod Persia, MD   Medication Sig Dispense Refill   ??? clonazePAM (KLONOPIN) 1 MG tablet 1/2- 1 tab q.h.s. p.r.n. muscle cramps insomnia ; APPOINTMENT NECESSARY FOR ALL CONTROLLED SUBSTANCE REFILLS 90 tablet 0   ??? atorvastatin (LIPITOR) 20 MG tablet Take 1 tablet by mouth daily 90 tablet 4   ??? omeprazole (PRILOSEC) 40 MG delayed release capsule Take 1 capsule by mouth daily 90 capsule 4   ??? FINACEA 15 % GEL        --      ROS:       SEE HPI  Weight:  Wt Readings from Last 3 Encounters:   09/20/18 135 lb (61.2 kg)   04/26/18 134 lb (60.8 kg)   09/17/17 125 lb (56.7 kg)       Review of Systems     No current vaginal or pelvic complaints  Does breast self-exam on a rare occasion  Wt Readings from Last 3 Encounters:   09/20/18 135 lb (61.2 kg)   04/26/18 134 lb (60.8 kg)   09/17/17 125 lb (56.7 kg)     See positives in HPI        Vitals:    09/20/18 1039   BP: 138/84   Pulse: 78   SpO2: 100%   Weight: 135 lb (61.2 kg)   Height: 5' 4.02" (1.626 m)     Body mass index is 23.16 kg/m??.  No LMP recorded. Patient is postmenopausal.    Physical Examination:   GENERAL APPEARANCE: alert and oriented, NAD.   EYES: PERRL, EOMI, no scleral icterus, conjunctiva clear.   NECK/THYROID: visually WNL.   CARDIOVASCULAR: see vitals.   RESPIRATORY: calm.   SKIN: Normal, warm and dry, no rash or skin lesions.   EXTREMITIES: no edema.     NECK/THYROID: supple, non-tender, no  lymphadenopathy, no thyromegaly.   CARDIOVASCULAR: Irregularly irregular  Electrocardiogram showing sinus arrhythmia     normal S1S2, no murmurs, click or rubs.   RESPIRATORY: CTA: no wheezes, rhonchi, rales.     Physical Exam    TM: flat pearly gray w normal light reflex / no fluid/ no  erythema  OP wnl / normal tonsils/ no erythema/ no exudate/ no petechia    CAROTID: NO BRUIT/ auscultated bilaterally  ABDOMEN: soft/ no guarding/ no rebound/ no mass  Nontender no HSM  Genitourinary - Female:   Refuses  :   BREASTS: no lumps palpated bilaterally, no nipple discharge, no skin changes; INSTRUCTED IN BSE/ENCOURAGED TO PERFORM MONTHLY.         LAB TESTS REVIEWED ALL PREVIOUS .     Results for POC orders placed in visit on 09/20/18   POCT glycosylated hemoglobin (Hb A1C)   Result Value Ref Range    Hemoglobin A1C 5.0 %       TIME spent : >    30    MINUTES : over 51% total time :    Counselling/coordinating care/ provided discussion as per plan/ patient instructions:  Handouts/ all  reviewed with patient    Controlled Substance Monitoring : oarrs revd today IF new patient OR controlled substance prescribed    Controlled Substance Monitoring:    Acute and Chronic Pain Monitoring:   RX Monitoring 04/26/2018   Attestation The Prescription Monitoring Report for this patient was reviewed today.   Periodic Controlled Substance Monitoring Possible medication side effects, risk of tolerance/dependence & alternative treatments discussed.   Chronic Pain > 50 MEDD Considered consultation with a specialist.   Chronic Pain > 80 MEDD Obtained or confirmed a written medication contract was on file.           Portions of note utilize Dragon dictation system:  mild errors may occur.    Assessment and plan at top of note    Tod Persia,  MD  09/30/18    ~~~~~~~~~~~~~~~~~~~~~~~~~~~~~~~~~~~~~~~~~~~~~~~~~~~~~~~  ~~~~~~~~~~~~~~~~~~~~~~~~~~~~~~~~~~~~~~~~~~~~~~~~~~~~~~~  ~~~~~~~~~~~~~~~~~~~~~~~~~~~~~~~~~~~~~~~~~~~~~~~~~~~~~~~        ===============================================================  CHART REVIEW :   pertinent details below/ edited / partial copy/paste data:          ===============================================================    See scanned documents: revd/ pertinent information  copied below    ===============================================================    LAST Visit(s) Notes reviewed/ significant notes/ edited version copied below:     Recent Visits with Me     Primary Dx    04/26/2018 Chronic midline low back pain without sciatica    ========  ??  HPI:   General IM:   CONCERNS:       Chief Complaint   Patient presents with   ??? Medication Refill   ?? ?? upcoming pe 09-20-18   ??  ??  Originally scheduled to see nurse practitioner but not here today  Prone to herniated disc  Past R sciatica   From ortho in past rx : pain meds  Then to pain mgmt  Other meds tried: approx 5-6 yrs ago  Epidural : helped  ??  High stress at that time: tension was high  \\  qhs med: x yr  Has tried off: ok x 2-3 night  Then after ??will have leg cramps  -needs to get up and move and stress  ??  No RLS  ??  Stress is high  Cares for elderly mother  ??  'muscle relaxers don't work'  'never ever'  ??  Does not recall ??trying elavil or neurontin  ??  Life is very sendentary  ??  ??  Past data/ past visits reviewed below.  ===========     PHQ Scores 09/17/2017   PHQ2 Score 0   PHQ9 Score 0   ??  Interpretation of Total Score Depression Severity:  1-4 = Minimal depression, 5-9 = Mild depression, 10-14 = Moderate depression, 15-19 = Moderately severe depression, 20-27 = Severe depression  ??  Assessment/ Plan:   ~~~~~~~~~~~~~~~~~~  ~~~~~~~~~~~~~~~~~~  ~~~~~~~~~~~~~~~~~~  ??  Counseled Re: Long discussion about benzodiazepines in the elderly and addiction and tolerance and other  modalities for muscle pain relief muscle spasm release stretching strengthening yoga myofascial release alternative medications  Stress management  ??  ??  ??  ??  ?? Diagnosis Orders   1. Chronic midline low back pain without sciatica  ??   2. Chronic bilateral low back pain with bilateral sciatica  clonazePAM (KLONOPIN) 1 MG tablet   3. Medication management  CBC   ?? Comprehensive Metabolic Panel   ?? Lipid Panel   ?? TSH without Reflex   ?? Vitamin B12   ?? Vitamin D 25 Hydroxy   ?? CK   ?? Magnesium   ?? Phosphorus   4. Situational stress  ??   5. Muscle cramps  CBC   ?? Comprehensive Metabolic Panel   ?? Lipid Panel   ?? TSH without Reflex   ?? Vitamin B12   ?? Vitamin D 25 Hydroxy   ?? CK   ?? Magnesium   ?? Phosphorus   6. Routine adult health maintenance  CBC   ?? Comprehensive Metabolic Panel   ?? Lipid Panel   ?? TSH without Reflex   ?? Vitamin B12   ?? Vitamin D 25 Hydroxy   ?? CK   ?? Magnesium   ?? Phosphorus   7. Pure hypercholesterolemia  Lipid Panel   8. Fatigue, unspecified type  CBC   ?? Comprehensive Metabolic Panel   ?? TSH without Reflex   ?? Vitamin B12   ?? Vitamin D 25 Hydroxy   ?? CK   ?? Magnesium   ?? Phosphorus   9. Vitamin D deficiency  Vitamin D 25 Hydroxy   ??  ??  Patient Instructions   You have been on Klonopin nightly for 5+ years due to muscle spasm  We discussed multiple reasons to get off this medication including aging and metabolism change/ tolerance  ??  Start daily stretching strengthening and possibly yoga  Look into massage and myofascial release  The above things will help you have fewer muscle cramps and spasms  ??  Start Elavil 10 mg at bedtime  --if Completely ineffective after 10+ days okay to increase to 2 tabs nightly  ??   change how you take your Klonopin: [place] this  as needed medication and try half instead of whole tab  ??  Note that the above 2 medications can have additive effects : Especially sedation  ??  Note that as use stop using the Klonopin you can have some rebound effect  Allow your body a chance  to adjust  ??  Plan follow-up in 3 months: This may be deferred if you're able to make the current 90 tablet prescription last until CPE 09/20/18  ??  Get labs in advance of CPE 09/20/2018  "orders in computer" / can get at lab on main floor/ come fasting  ??  FYI we now have available [facility]  : behavioral health consultant/  in-house psychologist Dr Aldean Jewett   --for stress management / anxiety/ depression   ==== You may schedule a separate appointment or meet with her following a future visit  ??  Consider physical therapy or guidance for myofascial release and/or massage  -Let me know if you'd like to do  this  ??  Consider finding out past medications so we can know what she tried and failed in the past  -Consider contacting past M.D. or pharmacy  ??  Lipitor can also cause muscle pain try off this medication x 10 days then retry!!  I got 1 more piece of paper,  ??  ??  Encounter Medications         Orders Placed This Encounter   Medications   ??? clonazePAM (KLONOPIN) 1 MG tablet   ?? ?? Sig: 1/2- 1 tab q.h.s. p.r.n. muscle cramps insomnia ; APPOINTMENT NECESSARY FOR ALL CONTROLLED SUBSTANCE REFILLS   ?? ?? Dispense:  90 tablet   ?? ?? Refill:  0   ??? amitriptyline (ELAVIL) 10 MG tablet   ?? ?? Sig: Take 1 tablet by mouth nightly   ?? ?? Dispense:  90 tablet   ?? ?? Refill:  1      ??  ??  ??  Return in about 3 months (around 07/27/2018).     06/02/2017 Bronchitis   Other Visits in Family Medicine     Provider Primary Dx   09/17/2017 Loanne Drilling, MD Routine adult health maintenance   06/20/2017 Dionicia Abler, APRN - CNP Bronchitis   11/15/2016 Dionicia Abler, APRN - CNP Gastroesophageal reflux disease without esophagitis           Past Medical History:   Diagnosis Date   ??? Cervical cancer (HCC)    ??? GERD (gastroesophageal reflux disease)    ??? Hyperlipidemia    ??? Lower back pain      Past Surgical History:   Procedure Laterality Date   ??? CERVICAL DISCECTOMY     ??? OTHER SURGICAL HISTORY      cone biopsy-cervical ca; pelvic prolapse x2    ??? ROTATOR CUFF REPAIR Right    ??? SKIN CANCER EXCISION      BCC     Family History   Problem Relation Age of Onset   ??? Cancer Mother         cervical   ??? Stroke Mother         TIA's   ??? Cancer Sister         esophageal, cervical   ??? Cancer Other         lung   ??? Cancer Sister         melanoma     Social History     Socioeconomic History   ??? Marital status: Divorced     Spouse name: None   ??? Number of children: None   ??? Years of education: None   ??? Highest education level: None   Occupational History   ??? None   Social Needs   ??? Financial resource strain: None   ??? Food insecurity:     Worry: None     Inability: None   ??? Transportation needs:     Medical: None     Non-medical: None   Tobacco Use   ??? Smoking status: Current Every Day Smoker     Packs/day: 1.00   ??? Smokeless tobacco: Never Used   Substance and Sexual Activity   ??? Alcohol use: Yes     Alcohol/week: 14.0 - 21.0 standard drinks     Types: 14 - 21 Glasses of wine per week   ??? Drug use: None   ??? Sexual activity: None   Lifestyle   ??? Physical activity:     Days per week: None  Minutes per session: None   ??? Stress: None   Relationships   ??? Social connections:     Talks on phone: None     Gets together: None     Attends religious service: None     Active member of club or organization: None     Attends meetings of clubs or organizations: None     Relationship status: None   ??? Intimate partner violence:     Fear of current or ex partner: None     Emotionally abused: None     Physically abused: None     Forced sexual activity: None   Other Topics Concern   ??? None   Social History Narrative   ??? None         No Known Allergies    Immunization History   Administered Date(s) Administered   ??? Influenza, High Dose (Fluzone 71 yrs and older) 09/17/2017, 09/20/2018   ??? Pneumococcal Conjugate 13-valent (Prevnar13) 05/31/2015   ??? Pneumococcal Polysaccharide (Pneumovax23) 05/25/2014   ??? Tdap (Boostrix, Adacel) 11/23/2017       New meds at discharge:      Medication List            Accurate as of 09/20/18 11:59 PM. If you have any questions, ask your nurse or doctor.               START taking these medications    ergocalciferol 50000 units capsule  Commonly known as:  ERGOCALCIFEROL  1 capsule Monday Wednesday Friday x 2 months then 1 cap weekly  Started by:  Tod Persia, MD     zoster recombinant adjuvanted vaccine 50 MCG/0.5ML Susr injection  Commonly known as:  SHINGRIX  Inject 0.5 mLs into the muscle once for 1 dose second dose: Usually 2-6 months after first  Started by:  Tod Persia, MD        CONTINUE taking these medications    atorvastatin 20 MG tablet  Commonly known as:  LIPITOR  Take 1 tablet by mouth daily     clonazePAM 1 MG tablet  Commonly known as:  KLONOPIN  1/2- 1 tab q.h.s. p.r.n. muscle cramps insomnia ; APPOINTMENT NECESSARY FOR ALL CONTROLLED SUBSTANCE REFILLS     FINACEA 15 % Gel  Generic drug:  Azelaic Acid     omeprazole 40 MG delayed release capsule  Commonly known as:  PRILOSEC  Take 1 capsule by mouth daily           Where to Get Your Medications      These medications were sent to [id number], [place] - [address] - P [phone] Carmon Ginsberg [phone]  [address], [place] [address]    Phone:  [phone]   ?? atorvastatin 20 MG tablet  ?? ergocalciferol 50000 units capsule  ?? omeprazole 40 MG delayed release capsule  ?? zoster recombinant adjuvanted vaccine 50 MCG/0.5ML Susr injection     You can get these medications from any pharmacy    Bring a paper prescription for each of these medications  ?? clonazePAM 1 MG tablet           Medicare Annual Wellness Visit  Name: Alainna Stawicki Today???s Date: 09/30/2018   MRN: [id number] Sex: Female   Age: 72 y.o. Ethnicity: Non-Hispanic/Non Latino   DOB: 10-17-1946 Race: Sohana Austell is here for Medicare AWV (pt requesting flu vaccine)    Screenings for behavioral,  psychosocial and functional/safety risks, and cognitive dysfunction are all negative except as indicated below.  These results, as well as other patient data from the Health Risk Assessment form, are documented in Flowsheets linked to this Encounter.    No Known Allergiesic  Prior to Visit Medications    Medication Sig Taking? Authorizing Provider   atorvastatin (LIPITOR) 20 MG tablet Take 1 tablet by mouth daily Yes Tod Persia, MD   omeprazole (PRILOSEC) 40 MG delayed release capsule Take 1 capsule by mouth daily Yes Tod Persia, MD   clonazePAM (KLONOPIN) 1 MG tablet 1/2- 1 tab q.h.s. p.r.n. muscle cramps insomnia ; APPOINTMENT NECESSARY FOR ALL CONTROLLED SUBSTANCE REFILLS Yes Tod Persia, MD   ergocalciferol (ERGOCALCIFEROL) 50000 units capsule 1 capsule Monday Wednesday Friday x 2 months then 1 cap weekly Yes Tod Persia, MD   FINACEA 15 % GEL  Yes Historical Provider, MD   ations    Medication Sig Taking? Authorizing Provider   atorvastatin (LIPITOR) 20 MG tablet Take 1 tablet by mouth daily Yes Tod Persia, MD   omeprazole (PRILOSEC) 40 MG delayed release capsule Take 1 capsule by mouth daily Yes Tod Persia, MD   clonazePAM (KLONOPIN) 1 MG tablet 1/2- 1 tab q.h.s. p.r.n. muscle cramps insomnia ; APPOINTMENT NECESSARY FOR ALL CONTROLLED SUBSTANCE REFILLS Yes Tod Persia, MD   zoster recombinant adjuvanted vaccine [facility]) 50 MCG/0.5ML SUSR injection Inject 0.5 mLs into the muscle once for 1 dose second dose: Usually 2-6 months after first Yes Tod Persia, MD   ergocalciferol (ERGOCALCIFEROL) 50000 units capsule 1 capsule Monday Wednesday Friday x 2 months then 1 cap weekly Yes Tod Persia, MD   FINACEA 15 % GEL  Yes Historical Provider, MD      Diagnosis Date   ??? Cervical cancer (HCC)    ??? GERD (gastroesophageal reflux disease)    ??? Hyperlipidemia    ??? Lower back pain      Past Surgical History:   Procedure Laterality Date   ??? CERVICAL DISCECTOMY     ??? OTHER SURGICAL HISTORY      cone biopsy-cervical ca; pelvic prolapse x2   ??? ROTATOR CUFF REPAIR Right    ??? SKIN CANCER EXCISION       BCC       Family History   Problem Relation Age of Onset   ??? Cancer Mother         cervical   ??? Stroke Mother         TIA's   ??? Cancer Sister         esophageal, cervical   ??? Cancer Other         lung   ??? Cancer Sister         melanoma       CareTeam (Including outside providers/suppliers regularly involved in providing care):   Patient Care Team:  Tod Persia, MD as PCP - General (Family Medicine)    Wt Readings from Last 3 Encounters:   09/20/18 135 lb (61.2 kg)   04/26/18 134 lb (60.8 kg)   09/17/17 125 lb (56.7 kg)     Vitals:    09/20/18 1039   BP: 138/84   Pulse: 78   SpO2: 100%   Weight: 135 lb (61.2 kg)   Height: 5' 4.02" (1.626 m)     Body mass index is 23.16 kg/m??.    Based upon direct observation of the patient, evaluation of cognition reveals  recent and remote memory intact.         Patient's complete Health Risk Assessment and screening values have been reviewed and are found in Flowsheets. The following problems were reviewed today and where indicated follow up appointments were made and/or referrals ordered.    Positive Risk Factor Screenings with Interventions:     Substance Abuse:  Social History     Tobacco History     Smoking Status  Current Every Day Smoker Smoking Frequency  1 pack/day    Smokeless Tobacco Use  Never Used          Alcohol History     Alcohol Use Status  Yes Drinks/Week  14-21 Glasses of wine per week Amount  14.0 - 21.0 standard drinks of alcohol/wk          Drug Use     Drug Use Status  Not Asked          Sexual Activity     Sexually Active  Not Asked               Audit Questionnaire: Screen for Alcohol Misuse  How often do you have a drink containing alcohol?: Four or more times a week  How many standard drinks containing alcohol do you have on a typical day when drinking?: One or two  How often do you have six or more drinks on one occasion?: Never  Audit-C Score: 4  Substance Abuse Interventions:  ?? Alcohol misuse/dependence:  Discussed alcohol use and  risks    Health Habits/Nutrition:  Health Habits/Nutrition  Do you exercise for at least 20 minutes 2-3 times per week?: (!) No  Have you lost any weight without trying in the past 3 months?: No  Do you eat fewer than 2 meals per day?: (!) Yes  Have you seen a dentist within the past year?: (!) No  Body mass index is 23.16 kg/m??.  Health Habits/Nutrition Interventions:  ?? Inadequate physical activity:  educational materials provided to promote increased physical activity  ?? Nutritional issues:  Transport planner for healthy, well-balanced diet provided  ?? Dental exam overdue:  patient encouraged to make appointment with his/her dentist, patient declines dental evaluation     Recommendations for Preventive Services Due: see orders and patient instructions/AVS.  .  Recommended screening schedule for the next 5-10 years is provided to the patient in written form: see Patient Instructions/AVS.    Problem List Items Addressed This Visit     None      Visit Diagnoses     Routine general medical examination at a health care facility    -  Primary    Pure hypercholesterolemia        Relevant Medications    atorvastatin (LIPITOR) 20 MG tablet    Other Relevant Orders    Lipid Panel    ALT    Gastroesophageal reflux disease without esophagitis        Relevant Medications    omeprazole (PRILOSEC) 40 MG delayed release capsule    Chronic bilateral low back pain with bilateral sciatica        Relevant Medications    clonazePAM (KLONOPIN) 1 MG tablet    Screening for breast cancer        Relevant Orders    MAM DIGITAL SCREEN W CAD BILATERAL    Screening for osteoporosis        Relevant Orders    DEXA Bone Density Axial Skeleton    Postmenopausal bone loss  Relevant Orders    DEXA Bone Density Axial Skeleton    Encounter for screening mammogram for malignant neoplasm of breast         Relevant Orders    MAM DIGITAL SCREEN W CAD BILATERAL    Irregular heart rhythm        Relevant Orders    EKG 12 Lead (Completed)     Restless leg syndrome        Muscle cramps        Relevant Medications    clonazePAM (KLONOPIN) 1 MG tablet    Medication management        Relevant Orders    Vitamin D 25 Hydroxy    Lipid Panel    ALT    Hemoglobin A1C    Vitamin D deficiency        Relevant Orders    Vitamin D 25 Hydroxy    IFG (impaired fasting glucose)        Relevant Orders    Hemoglobin A1C    POCT glycosylated hemoglobin (Hb A1C) (Completed)

## 2018-09-20 NOTE — Progress Notes (Signed)
The patient, Diane Santos, identity was verified by name and date of birth. VIS (s) given to patient for review prior to immunization administration.  Received informed consent to proceed with influenza immunization (s). Immunizations given as ordered. Tolerated procedure well.

## 2018-09-20 NOTE — Patient Instructions (Addendum)
Start vitamin D prescription: 3 times a week ??2 months then once weekly indefinitely    Fasting labs:  Done previously    Due to medications: Fasting labs in 6 months/ optional appointment to discuss/"orders in computer" / can get at lab on main floor    Mammograms :yearly : Order placed for DEC   Bone density test : ordered /  :you would like to defer this for now: prior to next appointment so we can review    Continue efforts for muscle health  Increase stretching    Decrease alcohol use : as this can dehydrate you and increase leg cramps  This also increases your risk for osteoporosis    Rx Klonapin: continue efforts to wean this  Rx #90 : try to make this labs 6-12 m    Recommend vaginal pap: due to past abn: plan for this next yr per your request  =======GENERAL INFO========================================  Move!  Be active!  Exercise!  Exercise should include: Aerobic, strength, flexibility/stretching.  Flexibility and stretching can include massage and gentle stretching and myofascial release modalities.    Monitor and limit "screen time"     Consider a once daily multivitamin  Additional supplements as needed:  CALCIUM recommend at least  1500 MG/D  VITAMIN D 2000-3000 U / D   FISH OIL 2-4GM/D (especially if heart risk or high cholesterol)     Vaccine update:   Immunization History   Administered Date(s) Administered   ??? Influenza, High Dose (Fluzone 65 yrs and older) 09/17/2017   ??? Pneumococcal Conjugate 13-valent (Prevnar13) 05/31/2015   ??? Pneumococcal Polysaccharide (Pneumovax23) 05/25/2014   ??? Tdap (Boostrix, Adacel) 11/23/2017        -Tetanus BOOSTER DUE 10 YEARS AFTER last booster (Td or Tdap) OR IF DIRTY CUT AND >26YRS SINCE  Last booster     NEW shingle vaccine  AGE 40 + :  Shingrix / series of 2 vaccines that give 90+ % protection  -Better than past Zostavax shingles vaccine  --- if needed : Rx will be given:   verify insurance coverage and where to get Pharmacy or MD office  INFLUENZA VACCINE EVERY  FALL/  COLONOSCOPY starting age 24- 96 or sooner if family history    SET GOALS! DAILY! WEEKLY! AND BEYOND  -START YOUR BUCKET LIST!  FIND YOUR HAPPY! YOUR BLISS! YOUR PASSION!  HOW CAN WE HELP YOU BE HEALTHIER TO ACHIEVE YOUR GOALS??  HEALTHY DIET!! ENCOURAGE LOW FAT/LOW CHOLESTEROL!  FRESH FRUITS/ VEGGIES! AVOID PROCESSED FOODS!  COOK FOR YOURSELF!!.    FYI we now have available [facility]  : behavioral health consultant/  in-house psychologist Dr Aldean Jewett   --for stress management / anxiety/ depression or other psychological health needs  ==== You may schedule a separate appointment or meet with her following a future visit.     Handouts given:   Wellness    fall prevention  Heart healthy diet  Exercise general  vitamin D  Prediabetes    Today's and previously stated goals:   Goals    None         Personalized Preventive Plan for Diane Santos - 09/20/2018  Medicare offers a range of preventive health benefits. Some of the tests and screenings are paid in full while other may be subject to a deductible, co-insurance, and/or copay.    Some of these benefits include a comprehensive review of your medical history including lifestyle, illnesses that may run in your family, and various assessments and screenings as  appropriate.    After reviewing your medical record and screening and assessments performed today your provider may have ordered immunizations, labs, imaging, and/or referrals for you.  A list of these orders (if applicable) as well as your Preventive Care list are included within your After Visit Summary for your review.    Other Preventive Recommendations:    ?? A preventive eye exam performed by an eye specialist is recommended every 1-2 years to screen for glaucoma; cataracts, macular degeneration, and other eye disorders.  ?? A preventive dental visit is recommended every 6 months.  ?? Try to get at least 150 minutes of exercise per week or 10,000 steps per day on a pedometer .  ?? Order or download the FREE  "Exercise & Physical Activity: Your Everyday Guide" from [employer]. Call [phone] [phone] or search [employer] online.  ?? You need 1200-1500 mg of calcium and 1000-2000 IU of vitamin D per day. It is possible to meet your calcium requirement with diet alone, but a vitamin D supplement is usually necessary to meet this goal.  ?? When exposed to the sun, use a sunscreen that protects against both UVA and UVB radiation with an SPF of 30 or greater. Reapply every 2 to 3 hours or after sweating, drying off with a towel, or swimming.  ?? Always wear a seat belt when traveling in a car. Always wear a helmet when riding a bicycle or motorcycle.

## 2018-10-09 NOTE — Telephone Encounter (Signed)
LM on VM to call us at [phone] to schedule annual mammogram.

## 2018-10-09 NOTE — Telephone Encounter (Signed)
Sent MyChart message

## 2018-10-29 ENCOUNTER — Encounter

## 2018-10-30 NOTE — Telephone Encounter (Signed)
From: Gar PontoMartha Thibeau  To: Tod PersiaKristin N Kranz, MD  Sent: 10/30/2018 1:41 PM EST  Subject: Non-Urgent Medical Question    Dr. Gerrit HeckKranz,    Could you recommend and Orthopedist (shoulder - rotator cuff issues) in the [place] area? [place] and he indicated there was a small tear, but didn't need surgery at that time. For the past couple of weeks I have had some pain in that area and would like to have it looked at again to see if the tear has gotten worse. While I can't have surgery at this time due to carrying for my mother I still would like to have another MRI done and possibly they could recommend some exercises for it.    Thank you.

## 2018-10-31 ENCOUNTER — Encounter

## 2018-11-15 NOTE — Telephone Encounter (Signed)
Left a voicemail for patient release the information about the specialist on her voicemail with the phone number.

## 2018-11-15 NOTE — Telephone Encounter (Signed)
Pt did not get MyChart note: pls contact

## 2018-11-25 ENCOUNTER — Inpatient Hospital Stay: Attending: Family Medicine | Primary: Family Medicine

## 2018-12-06 NOTE — Other (Unsigned)
Patient Acct Nbr: [id number]   Primary AUTH/CERT:   Primary Insurance Company Name: [employer]  Primary Insurance Plan name: Medicare A  Primary Insurance Group Number:   Primary Insurance Plan Type: [employer] managerMcare A  Primary Insurance Policy Number: [id number]    Secondary AUTH/CERT:   Secondary Insurance Company Name: [employer]  Secondary Insurance Plan name: Medicare B  Secondary Insurance Group Number:   Secondary Insurance Plan Type: Journalist, newspaperMcare B  Secondary Insurance Policy Number: [id number]    Tertiary AUTH/CERT:   [employer]iary Insurance Company Name: [employer] AARP Supplement  [employer]iary Insurance Plan name: [employer] AARP Supplement  [employer]iary Insurance Group Number:   [employer]ertiary Insurance Plan Type: [employer]  Tertiary Insurance Policy Number: [id number]

## 2019-03-24 ENCOUNTER — Encounter: Attending: Family Medicine | Primary: Family Medicine

## 2019-09-22 ENCOUNTER — Encounter: Attending: Family Medicine | Primary: Family Medicine

## 2019-09-24 ENCOUNTER — Encounter: Attending: Family Medicine | Primary: Family Medicine

## 2019-10-21 ENCOUNTER — Ambulatory Visit: Admit: 2019-10-21 | Discharge: 2019-10-21 | Payer: MEDICARE | Attending: Family | Primary: Family Medicine

## 2019-10-21 DIAGNOSIS — Z0000 Encounter for general adult medical examination without abnormal findings: Secondary | ICD-10-CM

## 2019-10-21 NOTE — Patient Instructions (Signed)
Personalized Preventive Plan for Diane Santos - 10/21/2019  Medicare offers a range of preventive health benefits. Some of the tests and screenings are paid in full while other may be subject to a deductible, co-insurance, and/or copay.    Some of these benefits include a comprehensive review of your medical history including lifestyle, illnesses that may run in your family, and various assessments and screenings as appropriate.    After reviewing your medical record and screening and assessments performed today your provider may have ordered immunizations, labs, imaging, and/or referrals for you.  A list of these orders (if applicable) as well as your Preventive Care list are included within your After Visit Summary for your review.    Other Preventive Recommendations:    ?? A preventive eye exam performed by an eye specialist is recommended every 1-2 years to screen for glaucoma; cataracts, macular degeneration, and other eye disorders.  ?? A preventive dental visit is recommended every 6 months.  ?? Try to get at least 150 minutes of exercise per week or 10,000 steps per day on a pedometer .  ?? Order or download the FREE "Exercise & Physical Activity: Your Everyday Guide" from [employer]. Call 5866933224 or search [employer] online.  ?? You need 1200-1500 mg of calcium and 1000-2000 IU of vitamin D per day. It is possible to meet your calcium requirement with diet alone, but a vitamin D supplement is usually necessary to meet this goal.  ?? When exposed to the sun, use a sunscreen that protects against both UVA and UVB radiation with an SPF of 30 or greater. Reapply every 2 to 3 hours or after sweating, drying off with a towel, or swimming.  ?? Always wear a seat belt when traveling in a car. Always wear a helmet when riding a bicycle or motorcycle.

## 2019-10-21 NOTE — Progress Notes (Signed)
Medicare Annual Wellness Visit  Name: Diane Santos Today???s Date: 10/21/2019   MRN: [id number] Sex: Female   Age: 73 y.o. Ethnicity: Non-Hispanic/Non Latino   DOB: [birth year] Race: Diane Santos is here for Medicare AWV (HRA completed through [facility]) and Immunizations (Got flu shot on 08/13/2019)    Screenings for behavioral, psychosocial and functional/safety risks, and cognitive dysfunction are all negative except as indicated below. These results, as well as other patient data from the [place] [facility] form, are documented in Flowsheets linked to this Encounter.    No Known Allergies      Prior to Visit Medications    Not on File         Past Medical History:   Diagnosis Date   ??? Cervical cancer ([place])    ??? GERD (gastroesophageal reflux disease)    ??? Hyperlipidemia    ??? Lower back pain        Past Surgical History:   Procedure Laterality Date   ??? CERVICAL DISCECTOMY     ??? OTHER SURGICAL HISTORY      cone biopsy-cervical ca; pelvic prolapse x2   ??? ROTATOR CUFF REPAIR Right    ??? SKIN CANCER EXCISION      BCC         Family History   Problem Relation Age of Onset   ??? Cancer Mother         cervical   ??? Stroke Mother         TIA's   ??? Cancer Sister         esophageal, cervical   ??? Cancer Other         lung   ??? Cancer Sister         melanoma       CareTeam (Including outside providers/suppliers regularly involved in providing care):   Patient Care Team:  Doretha Imus, MD as PCP - General (Family Medicine)    Wt Readings from Last 3 Encounters:   10/21/19 133 lb 9.6 oz (60.6 kg)   09/20/18 135 lb (61.2 kg)   04/26/18 134 lb (60.8 kg)     Vitals:    10/21/19 1127   BP: 129/63   Site: Right Upper Arm   Position: Sitting   Cuff Size: Medium Adult   Pulse: 78   Temp: 97.4 ??F (36.3 ??C)   TempSrc: Temporal   SpO2: 98%   Weight: 133 lb 9.6 oz (60.6 kg)   Height: 5\' 4"  (1.626 m)     Body mass index is 22.93 kg/m??.    Based upon direct observation of the patient, evaluation of cognition reveals recent and remote  memory intact. Mini-cog 5/5.     Patient's complete Health Risk Assessment and screening values have been reviewed and are found in Flowsheets. The following problems were reviewed today and where indicated follow up appointments were made and/or referrals ordered.    Positive Risk Factor Screenings with Interventions:     Substance History:  Social History     Tobacco History     Smoking Status  Current Every Day Smoker Smoking Frequency  0.75 packs/day    Smokeless Tobacco Use  Never Used          Alcohol History     Alcohol Use Status  Yes Drinks/Week  14-21 Glasses of wine per week Amount  14.0 - 21.0 standard drinks of alcohol/wk          Drug Use  Drug Use Status  Never          Sexual Activity     Sexually Active  Not Asked               Alcohol Screening:  Audit-C Score: 1  Total Score: 1    A score of 8 or more is associated with harmful or hazardous drinking. A score of 13 or more in women, and 15 or more in men, is likely to indicate alcohol dependence.  Substance Abuse Interventions:  ?? Tobacco abuse:  patient is not ready to work toward tobacco cessation at this time    General Health and ACP:  General  In general, how would you say your health is?: Excellent  In the past 7 days, have you experienced any of the following? New or Increased Pain, New or Increased Fatigue, Loneliness, Social Isolation, Stress or Anger?: None of These  Do you get the social and emotional support that you need?: Yes  Do you have a Living Will?: Yes  Advance Directives     Power of Attorney Living Will ACP-Advance Directive ACP-Power of Attorney    Not on File Not on File [place] Filed      General Health Risk Interventions:  ?? None    Health Habits/Nutrition:  Health Habits/Nutrition  Do you exercise for at least 20 minutes 2-3 times per week?: (!) No  Have you lost any weight without trying in the past 3 months?: No  Do you eat fewer than 2 meals per day?: No  Have you seen a dentist within the past year?: (!) No  Body  mass index: 22.93  Health Habits/Nutrition Interventions:  ?? Inadequate physical activity:  patient is not ready to increase his/her physical activity level at this time  ?? Dental exam overdue:  patient encouraged to make appointment with his/her dentist    Personalized Preventive Plan   Current Health Maintenance Status  Immunization History   Administered Date(s) Administered   ??? Influenza, High Dose (Fluzone 65 yrs and older) 09/17/2017, 09/20/2018   ??? Influenza, High-dose, Quadv, 65 yrs +, IM (Fluzone) 08/13/2019   ??? Pneumococcal Conjugate 13-valent (Prevnar13) 05/31/2015   ??? Pneumococcal Polysaccharide (Pneumovax23) 05/25/2014   ??? Tdap (Boostrix, Adacel) 11/23/2017      Health Maintenance   Topic Date Due   ??? Shingles Vaccine (1 of 2) 12/07/1997   ??? Annual Wellness Visit (AWV)  05/30/2018   ??? DEXA (modify frequency per FRAX score)  10/20/2020 (Originally 12/07/2002)   ??? Breast cancer screen  11/25/2020   ??? Lipid screen  09/17/2023   ??? Colon cancer screen colonoscopy  04/20/2024   ??? DTaP/Tdap/Td vaccine (2 - Td) 11/24/2027   ??? Flu vaccine  Completed   ??? Pneumococcal 65+ years Vaccine  Completed   ??? Hepatitis C screen  Completed   ??? Hepatitis A vaccine  Aged Out   ??? Hepatitis B vaccine  Aged Out   ??? Hib vaccine  Aged Out   ??? Meningococcal (ACWY) vaccine  Aged Out     Recommendations for Preventive Services Due: see orders and patient instructions/AVS.  .  Recommended screening schedule for the next 5-10 years is provided to the patient in written form: see Patient Instructions/AVS.    Problem List Items Addressed This Visit     None      Visit Diagnoses     Routine general medical examination at a health care facility    -  Primary  Pure hypercholesterolemia        Relevant Orders    Lipid Panel  Stopped statin on own 2/2 to severe myalgias- now resolved. Off about 6 months now.   Check fasting lipid panel.   Consider alternative statin vs diet/exercise. +Tobacco Use. No hx CAD, CVA, HTN, DM. Prefers not to be  on medication- will reevaluate risks vs. Benefits after blood work. Pt agreeable to plan.        The 10-year ASCVD risk score Denman George DC Montez Hageman., et al., 2013) is: 16.5%    Values used to calculate the score:      Age: 9 years      Sex: Female      Is Non-Hispanic African American: No      Diabetic: No      Tobacco smoker: Yes      Systolic Blood Pressure: 129 mmHg      Is BP treated: No      HDL Cholesterol: 66 mg/dL      Total Cholesterol: 211 mg/dL

## 2021-03-14 IMAGING — CT CT CHEST SCREENING FOR LUNG CANCER
2 of 5 series · 15 of 31 positions shown, 19 images · IV contrast (agent unspecified)
Comparison: No priors.

LOW DOSE SCREENING CT OF THE CHEST WITHOUT CONTRAST:
CLINICAL INDICATION: 74-year-old female smoker with greater than 25 pack-year history, for lung cancer screening.
TECHNIQUE: Multiple axial images were obtained through the chest without the use of intravenous contrast. Low-dose technique was utilized. Subsequent axial and coronal MIP reconstructions with sagittal multiplanar reconstructions.

[Series 3: thins · axial · 0.70mm/px · z∈[-258,+12]mm · 13 of 511 slices shown, 17 images]
[im 40/511  mediastinal]
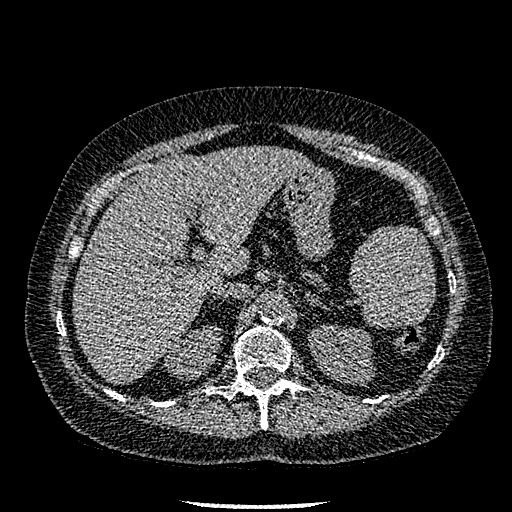
[im 40/511  lung]
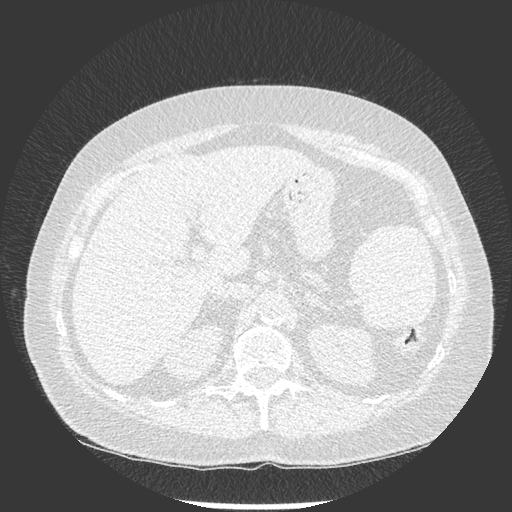
[im 79/511  lung]
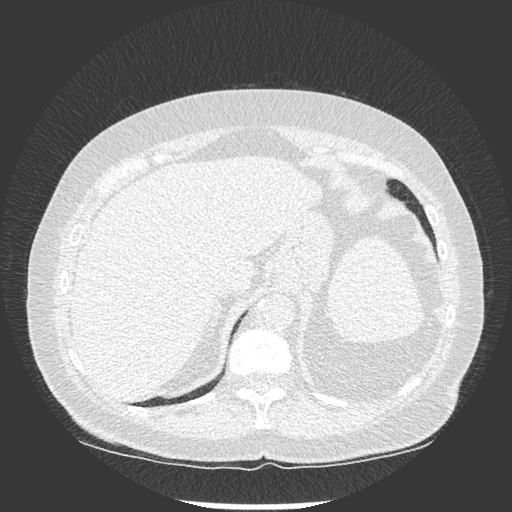
[im 118/511  lung]
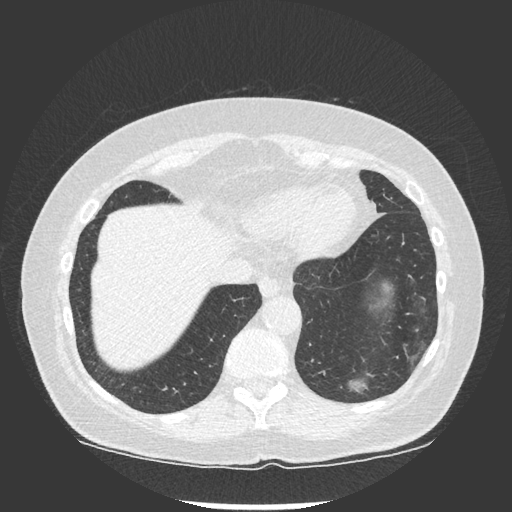
[im 157/511  lung]
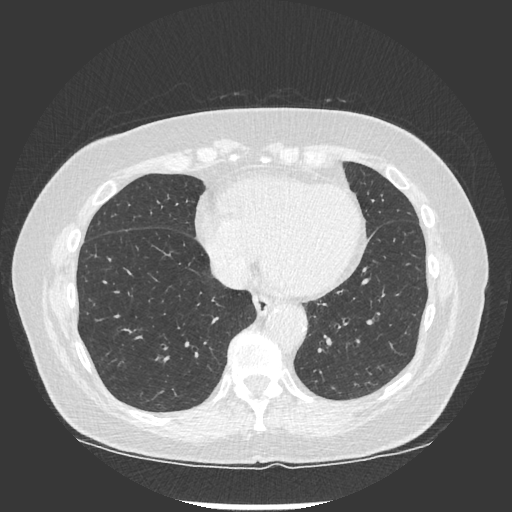
[im 197/511  mediastinal]
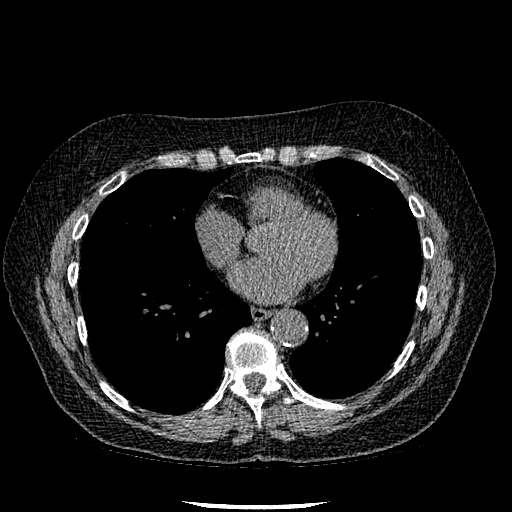
[im 197/511  lung]
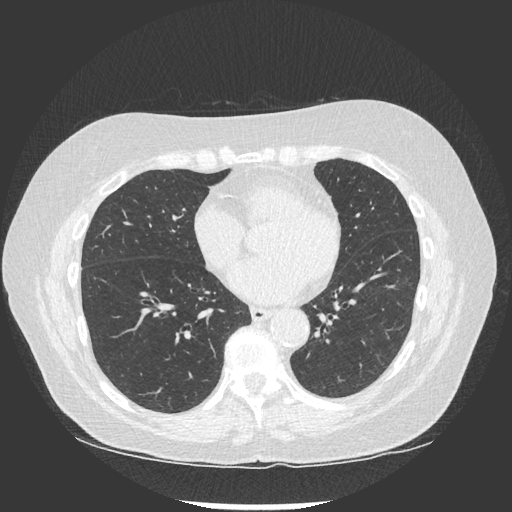
[im 236/511  lung]
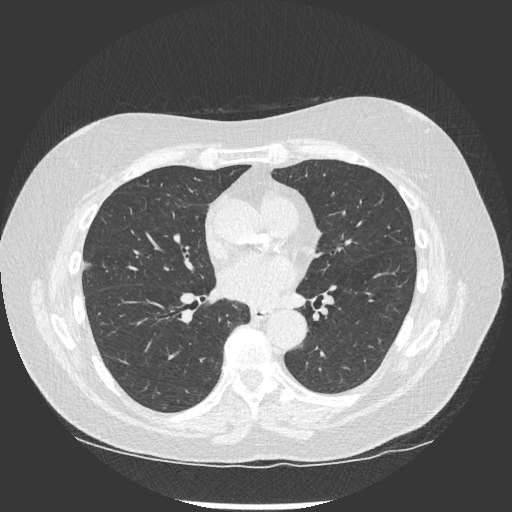
[im 257/511  lung]
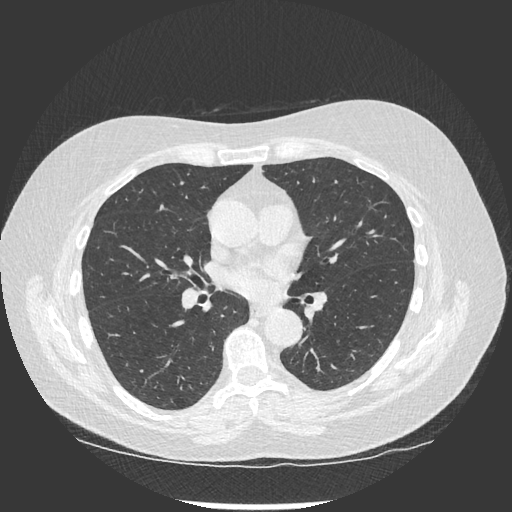
[im 275/511  lung]
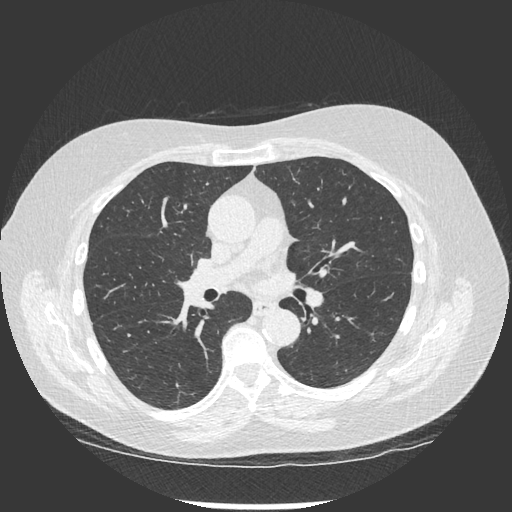
[im 314/511  mediastinal]
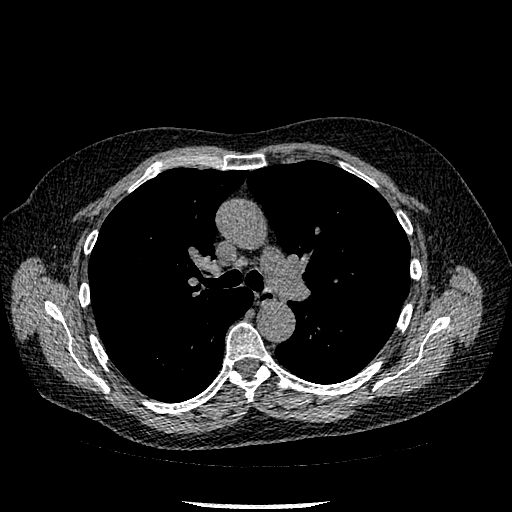
[im 314/511  lung]
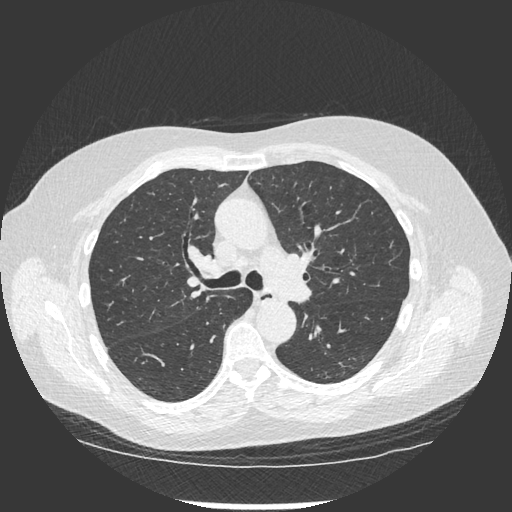
[im 354/511  lung]
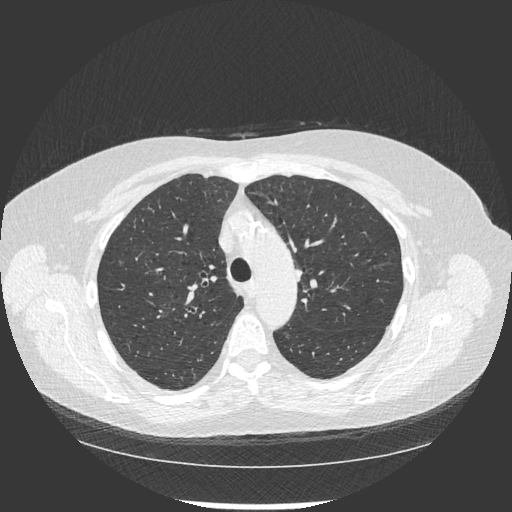
[im 393/511  lung]
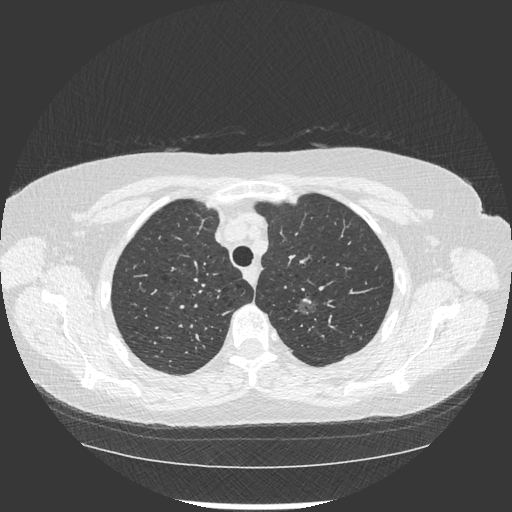
[im 432/511  lung]
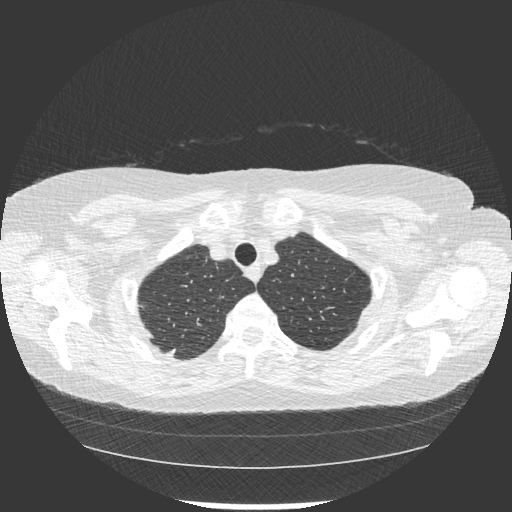
[im 471/511  mediastinal]
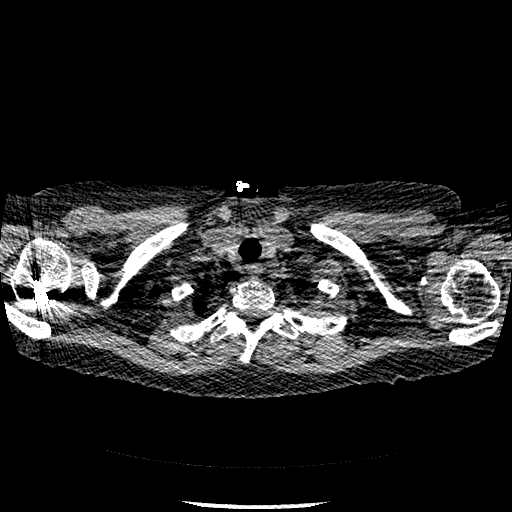
[im 471/511  lung]
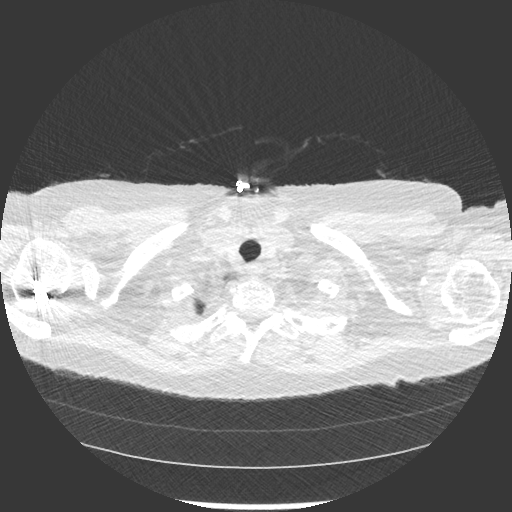

[Series 601: sagittal · sagittal · 0.70mm/px · 2 of 181 slices shown]
[im 46/181  lung]
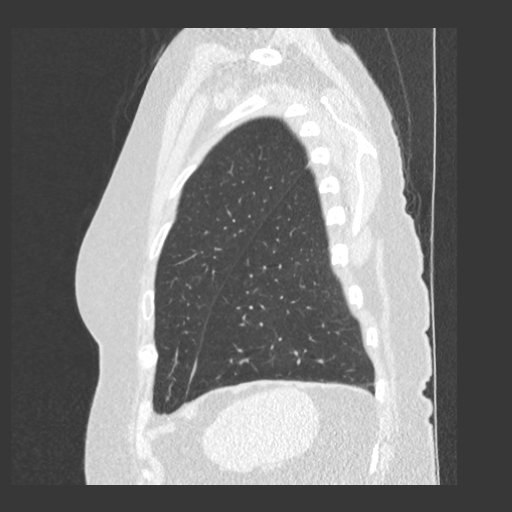
[im 91/181  lung]
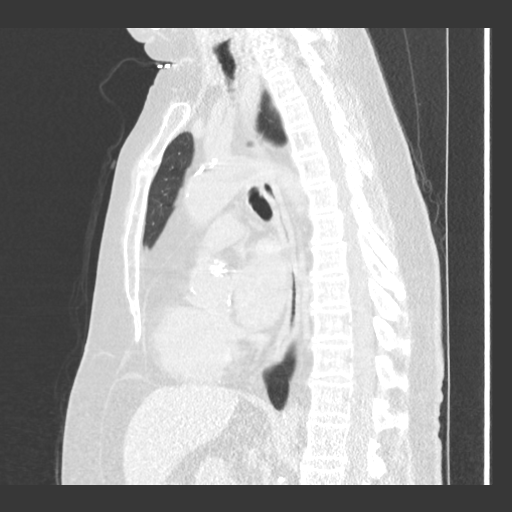

[15 of 31 positions shown; findings below may reference images not displayed]

FINDINGS: LUNGS AND PLEURA: Minimal, upper lobe predominant emphysematous parenchymal disease. Lungs clear.  No effusions.
MEDIASTINUM: No masses.
LYMPH NODES: No adenopathy.
HEART: Normal in size. Aortic valvular calcification. Multivessel coronary artery calcification. No pericardial effusion.
AORTA AND GREAT VESSELS: No aneurysm or dissection.
OSSEOUS STRUCTURES: Diffuse osteopenia. No acute fracture or destructive lesion.
UPPER ABDOMEN: Portions of the abdomen included are normal.
SOFT TISSUE: Normal.
IMPRESSION: 1.  No suspicious or significant pulmonary nodule.
2.  Multivessel coronary artery calcification.
ACR LUNG-RADS: Lung-RADS category 1 (Negative, less than 1% change of malignancy). Recommend follow-up LDCT chest in one year.
LOCATION CODE: 13
Yes
Current Smoker

## 2021-05-07 IMAGING — CT CT HEAD WITHOUT CONTRAST
2 of 3 series · 15 of 40 positions shown, 18 images · non-contrast
Comparison: none

FINAL REPORT:
CT scan of the head. [DATE], [id number] hours
CLINICAL HISTORY: Dizziness, right arm weakness
TECHNIQUE: Helical axial sections with sagittal and coronal reformats of the head were obtained without contrast. Iterative reconstruction technique was employed to reduce patient radiation exposure.

[Series 2: head stnd · axial · 0.44mm/px · z∈[+19,+151]mm · 12 of 32 slices shown, 15 images]
[im 3/32  brain]
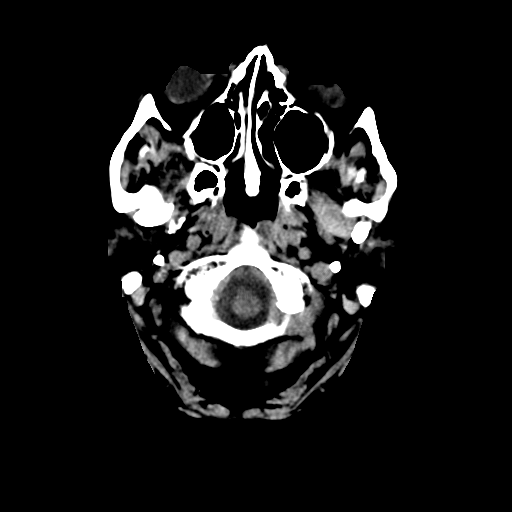
[im 3/32  bone]
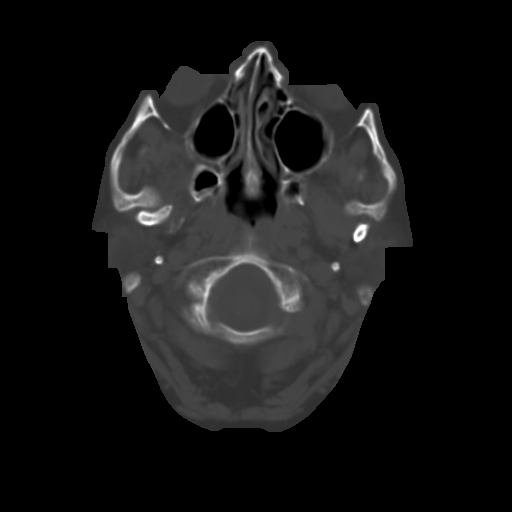
[im 5/32  brain]
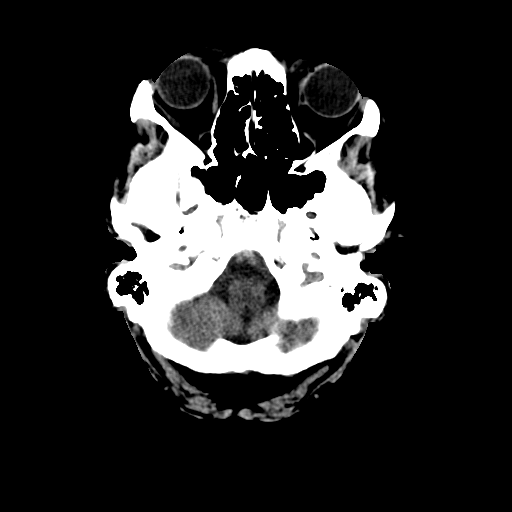
[im 7/32  brain]
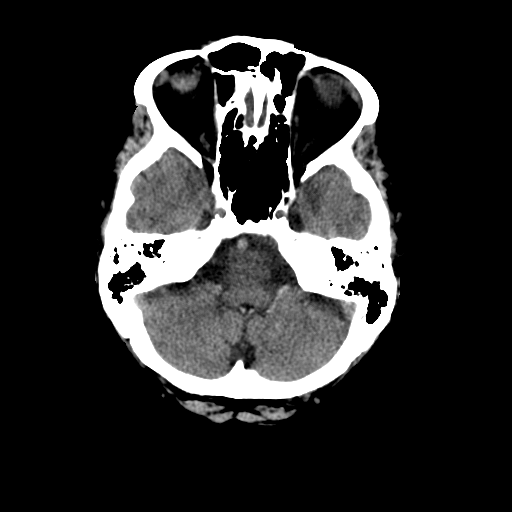
[im 10/32  brain]
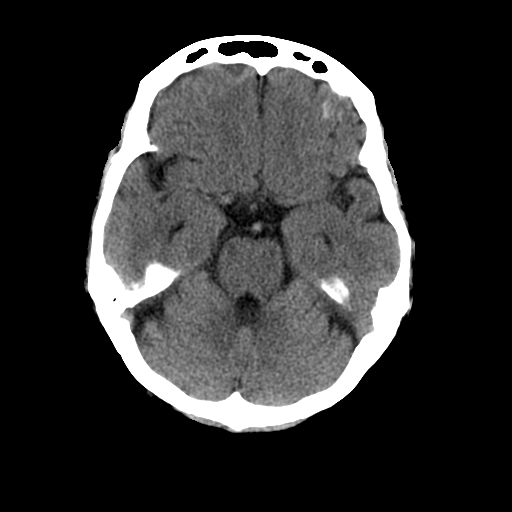
[im 12/32  brain]
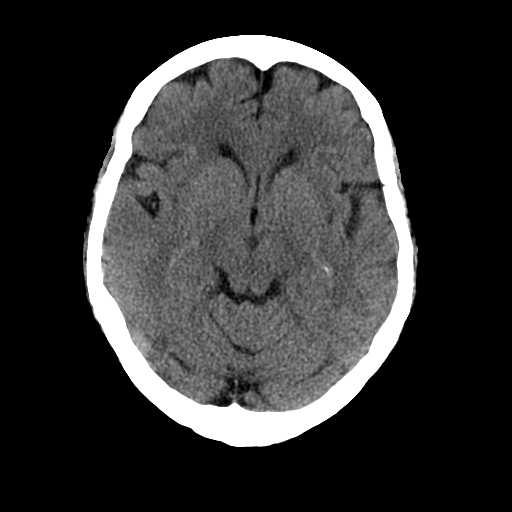
[im 12/32  bone]
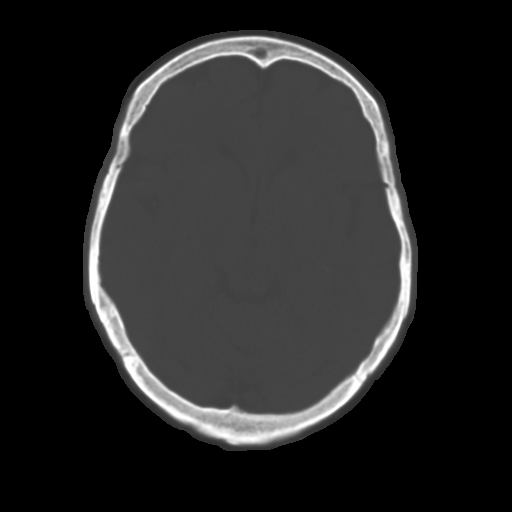
[im 14/32  brain]
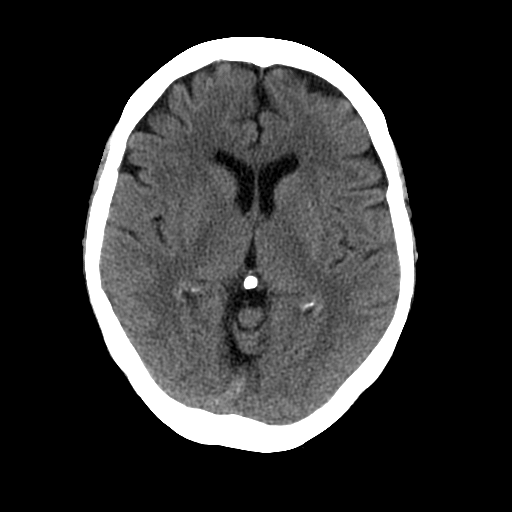
[im 18/32  brain]
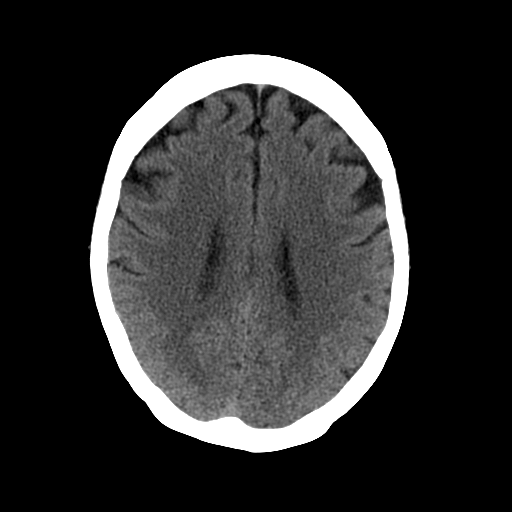
[im 20/32  brain]
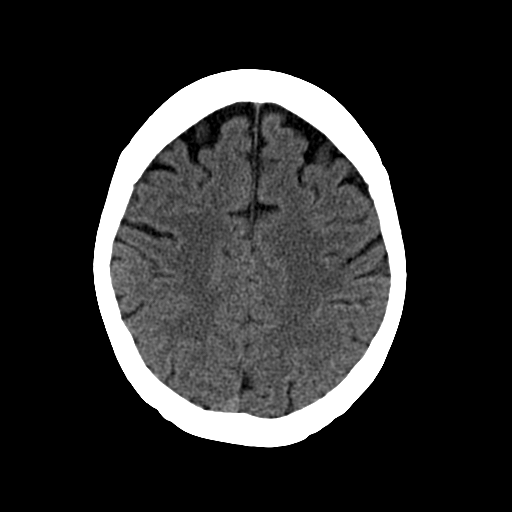
[im 22/32  brain]
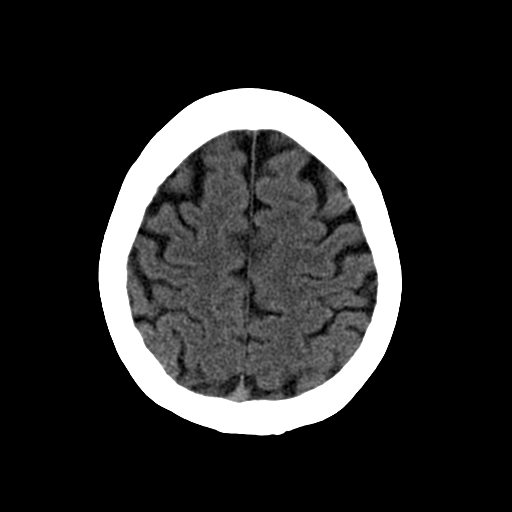
[im 22/32  bone]
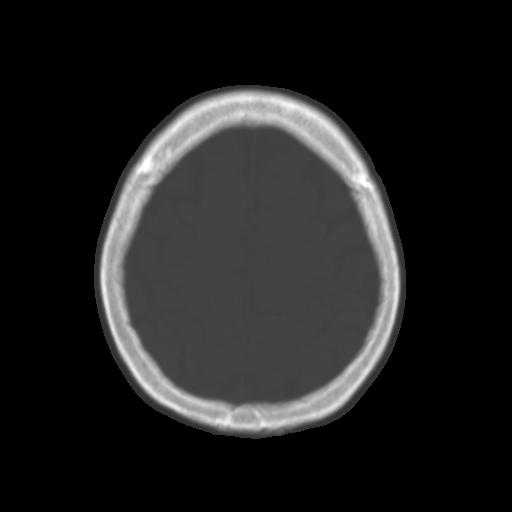
[im 25/32  brain]
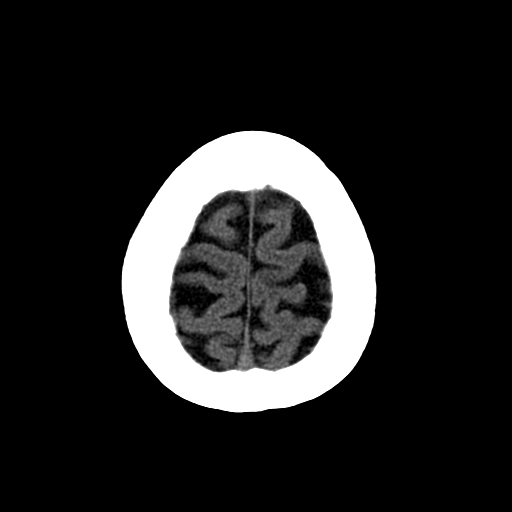
[im 27/32  brain]
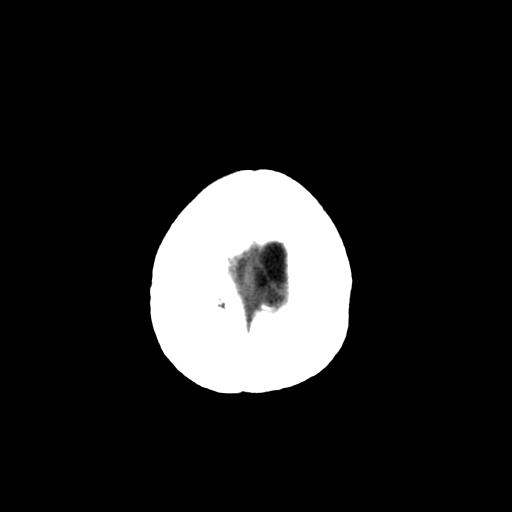
[im 29/32  brain]
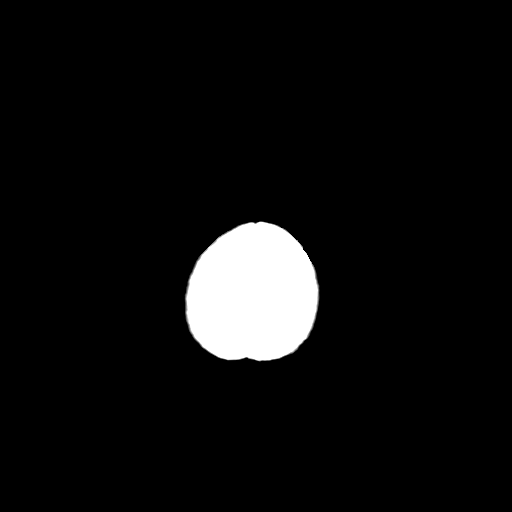

[Series 601: cor head · coronal · 0.44mm/px · 3 of 99 slices shown]
[im 25/99  brain]
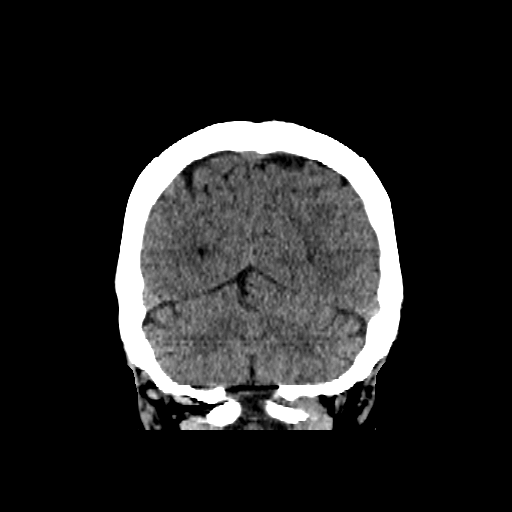
[im 50/99  brain]
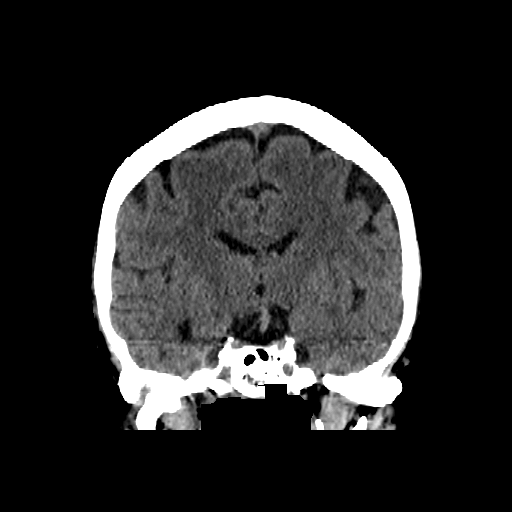
[im 74/99  brain]
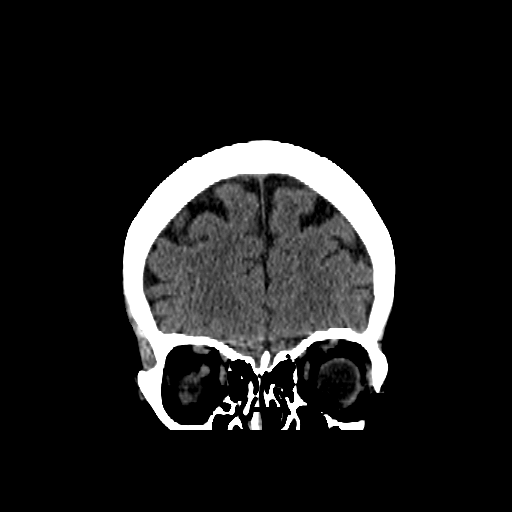

[15 of 40 positions shown; findings below may reference images not displayed]

FINDINGS: There is no evidence of intracranial hemorrhage, mass effect or midline shift. There are periventricular white matter hypodensities, compatible with chronic small vessel ischemia. The CSF spaces are prominent consistent with volume loss. There is atheromatous calcification of the intracranial arteries.  The calvarium is unremarkable. The mastoid air cells and the visualized paranasal sinuses are clear.
IMPRESSION: No evidence of intracranial hemorrhage, mass effect or midline shift.
Periventricular chronic small vessel ischemia and volume loss.

## 2021-05-07 IMAGING — CR XR CHEST 1 VIEW
1 series · 1 of 1 positions shown · non-contrast
Comparison: none

FINAL REPORT:
Radiograph of the chest (single view). [DATE], [id number] hours
CLINICAL HISTORY: dizziness

[AP]
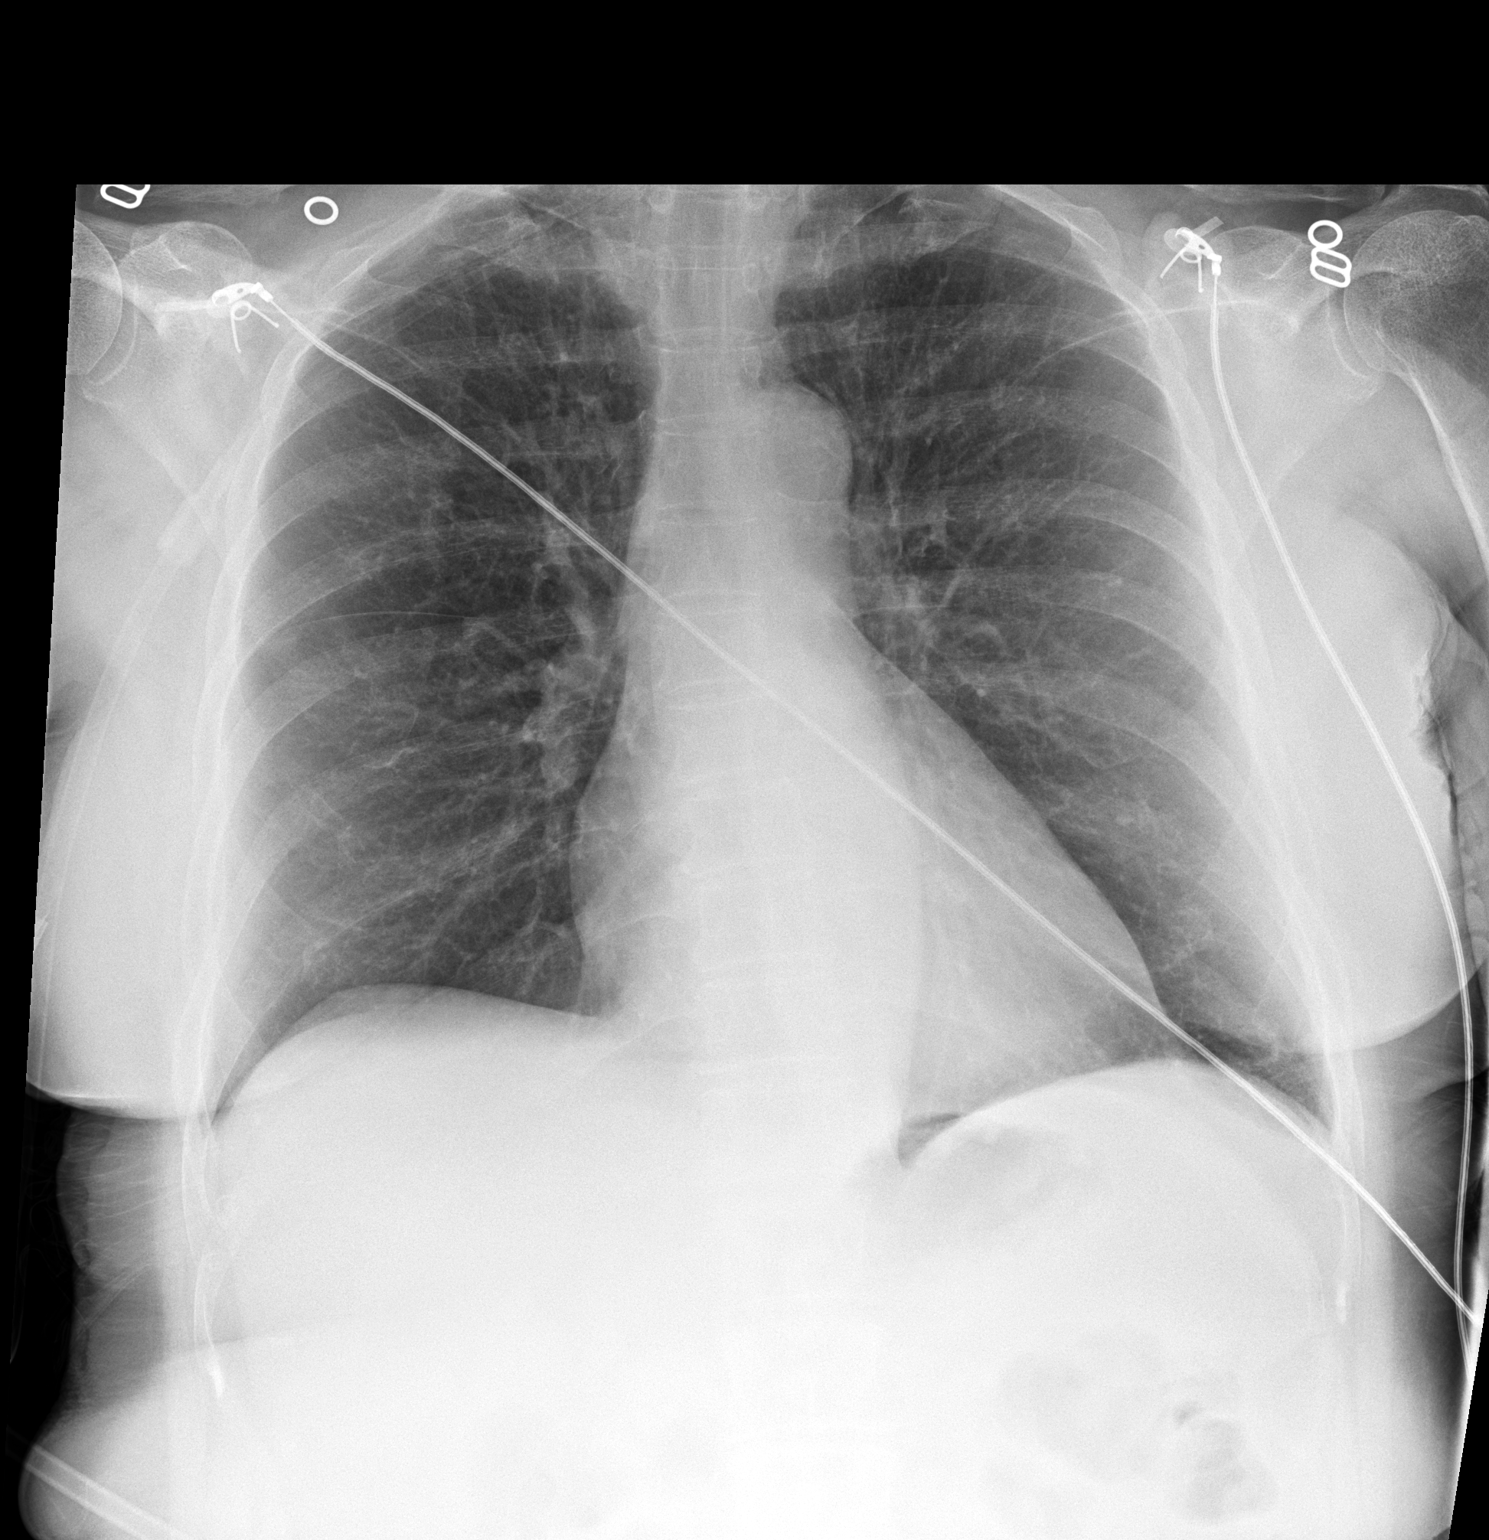

[1 of 1 positions shown; findings below may reference images not displayed]

FINDINGS: The heart, mediastinum and pulmonary hila are unremarkable.
Prominent interstitial lung markings are seen.
There is no pleural effusion.
The bony thorax is unremarkable.
IMPRESSION: Mild vascular congestion.
Portable?
Yes

## 2021-05-08 IMAGING — MR MRI BRAIN WITHOUT CONTRAST
12 series · 46 of 48 positions shown · non-contrast
Comparison: Noncontrast head CT 05/07/2021

FINAL REPORT:
MRI BRAIN WITHOUT CONTRAST
INDICATION: Stroke, follow up
LOCATION CODE: 10

[Series 1: survey · sagittal · 1.6mm · 1.62mm/px · 15 of 128 slices shown]
[im 1/128]
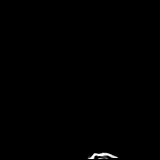
[im 8/128]
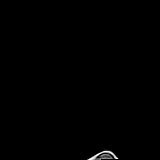
[im 16/128]
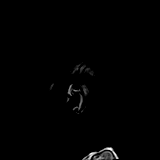
[im 24/128]
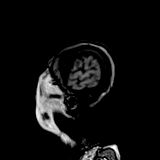
[im 32/128]
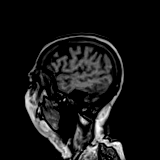
[im 40/128]
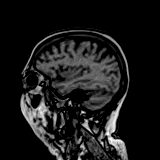
[im 48/128]
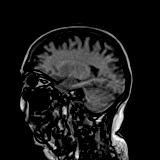
[im 56/128]
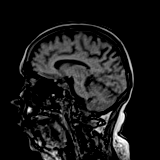
[im 64/128]
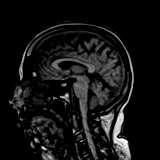
[im 72/128]
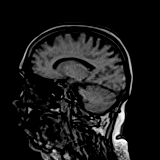
[im 80/128]
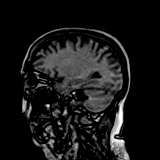
[im 88/128]
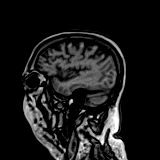
[im 96/128]
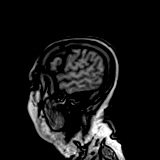
[im 104/128]
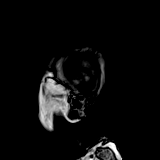
[im 120/128]
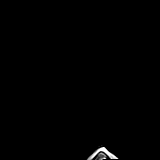

[Series 2: survey_mpr_sag · sagittal · 1.6mm · 1.60mm/px · 1 of 5 slices shown]
[im 1/5]
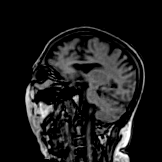

[Series 3: survey_mpr_cor · coronal · 1.6mm · 1.60mm/px · 1 of 3 slices shown]
[im 1/3]
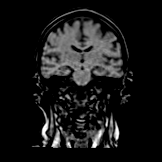

[Series 4: survey_mpr_(person_name) · axial · 1.6mm · 1.60mm/px · 1 of 3 slices shown]
[im 1/3]
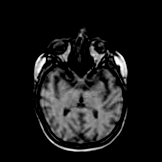

[Series 5: T1 · sagittal · 5.0mm · 0.72mm/px · 2 of 24 slices shown (1 of 2)]
[im 1/24]
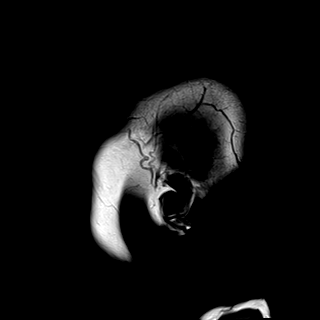
[im 24/24]
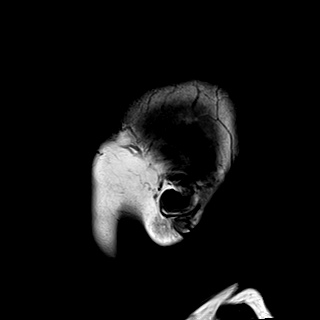

[Series 6: ax dwi_tracew · axial · 4.0mm · 0.60mm/px · z∈[-46,+88]mm · 3 of 25 slices shown]
[im 1/25]
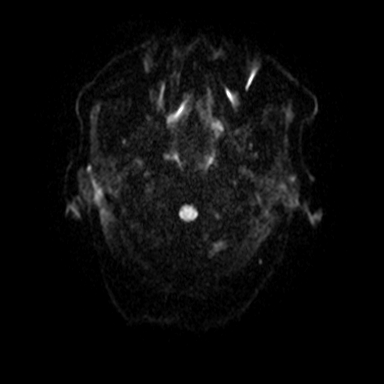
[im 13/25]
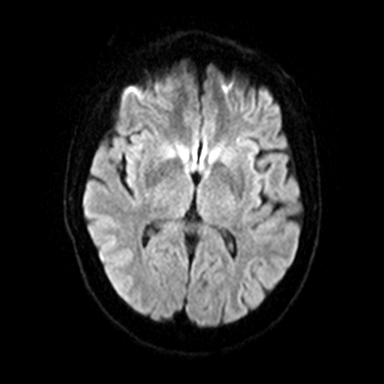
[im 25/25]
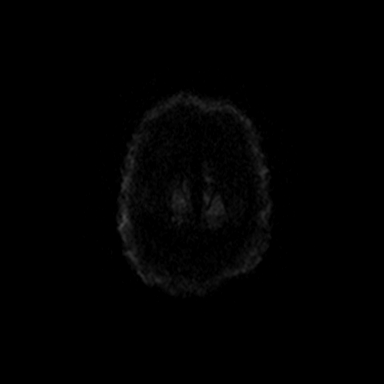

[Series 7: ax dwi_adc · axial · 4.0mm · 0.60mm/px · z∈[-46,+88]mm · 3 of 25 slices shown]
[im 1/25]
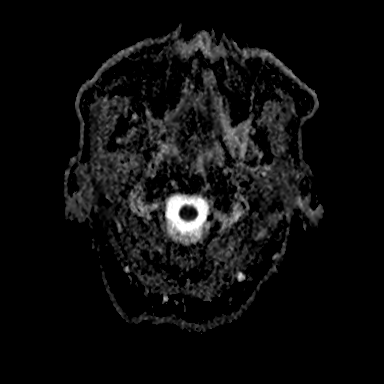
[im 13/25]
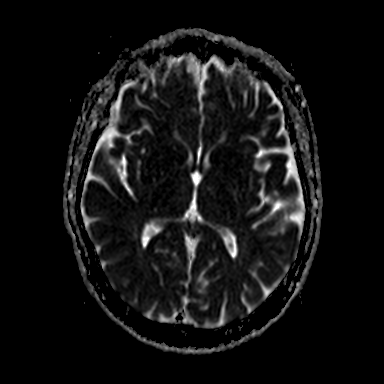
[im 25/25]
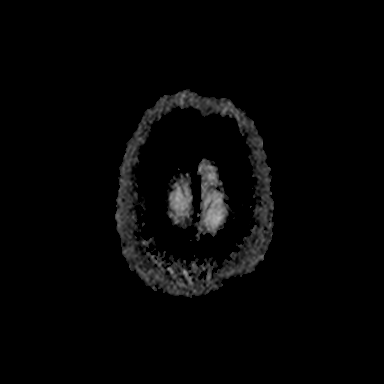

[Series 8: ax dwi_exp · axial · 4.0mm · 0.60mm/px · z∈[-46,+88]mm · 4 of 25 slices shown]
[im 1/25]
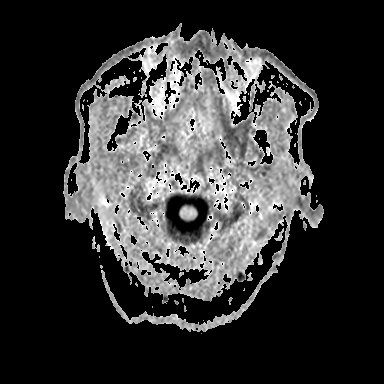
[im 9/25]
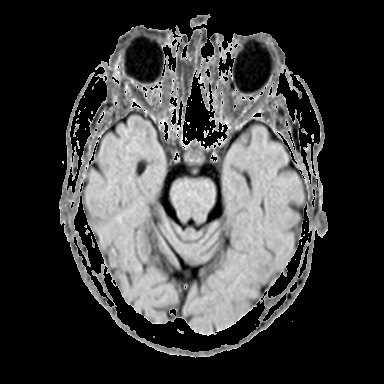
[im 17/25]
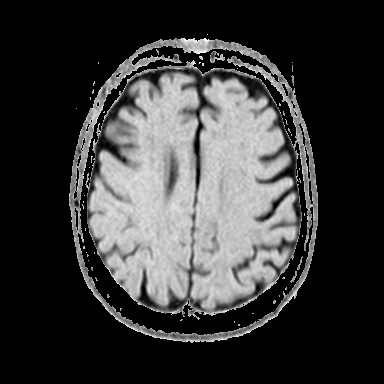
[im 25/25]
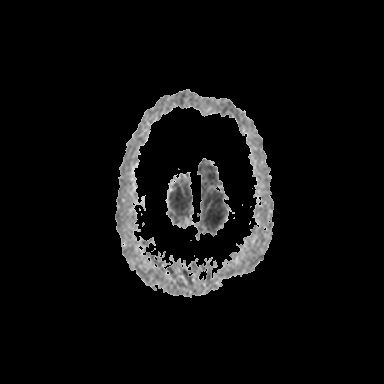

[Series 9: T2 · axial · 4.0mm · 0.45mm/px · z∈[-46,+88]mm · 4 of 25 slices shown]
[im 1/25]
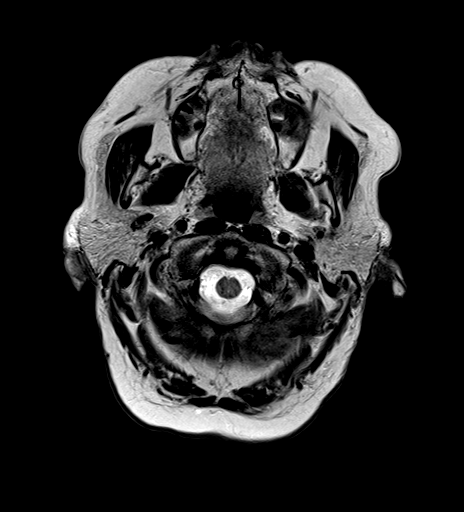
[im 9/25]
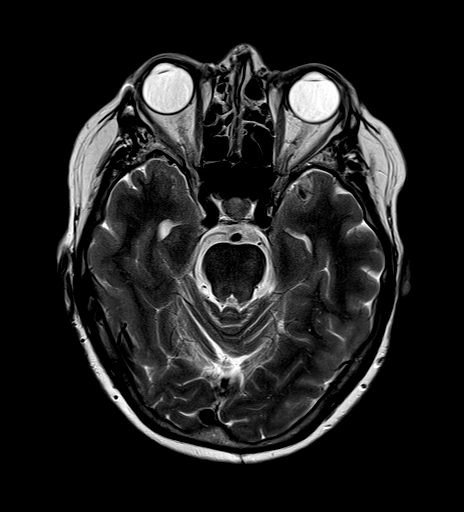
[im 17/25]
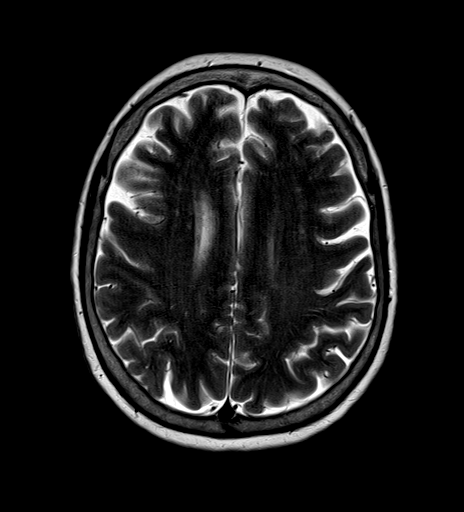
[im 25/25]
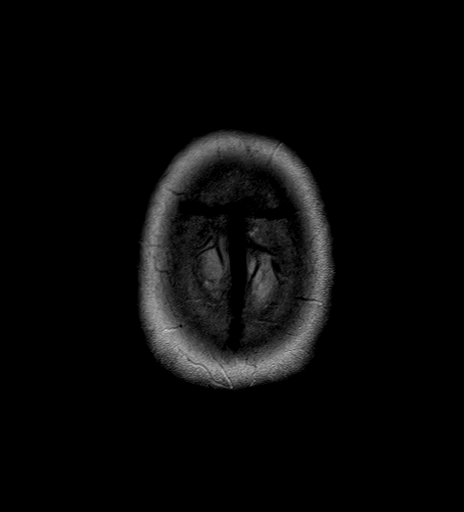

[Series 10: T1 · axial · 4.0mm · 0.72mm/px · z∈[-46,+88]mm · 4 of 25 slices shown (2 of 2)]
[im 1/25]
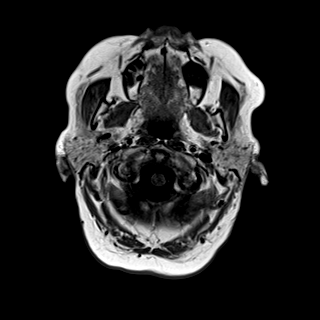
[im 9/25]
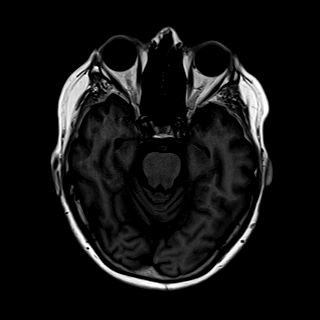
[im 17/25]
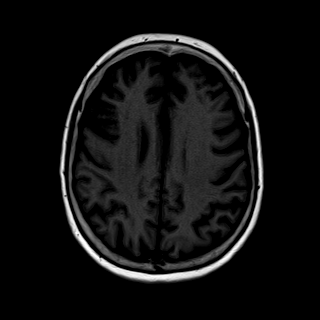
[im 25/25]
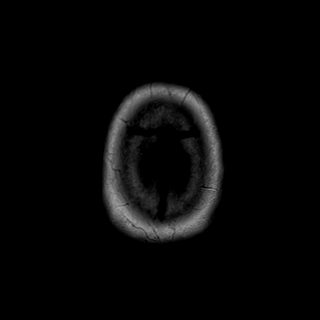

[Series 11: FLAIR · axial · 4.0mm · 0.72mm/px · z∈[-46,+88]mm · 4 of 25 slices shown]
[im 1/25]
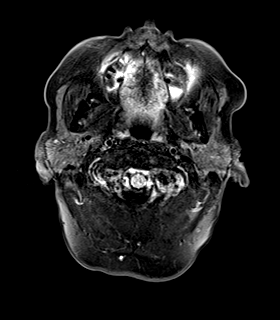
[im 9/25]
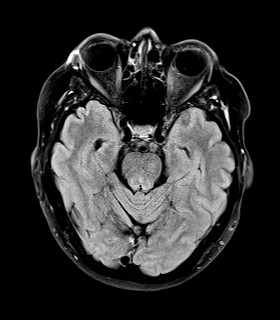
[im 17/25]
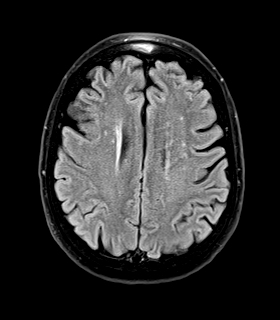
[im 25/25]
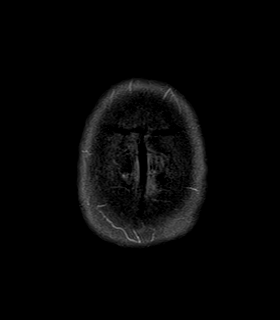

[Series 12: GRE · axial · 4.0mm · 0.45mm/px · z∈[-46,+88]mm · 4 of 25 slices shown]
[im 1/25]
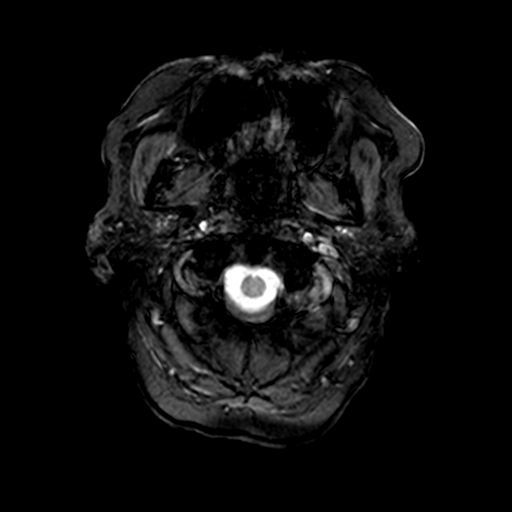
[im 9/25]
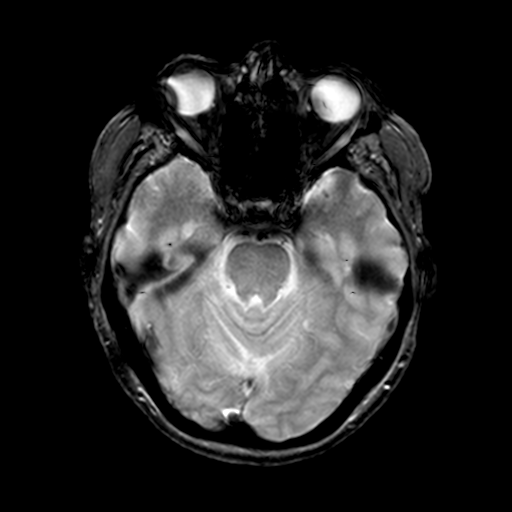
[im 17/25]
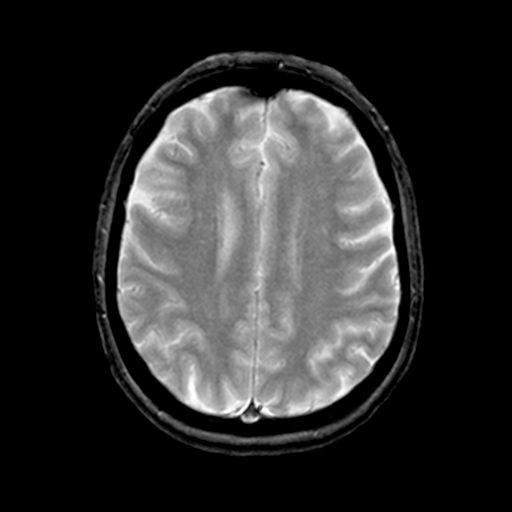
[im 25/25]
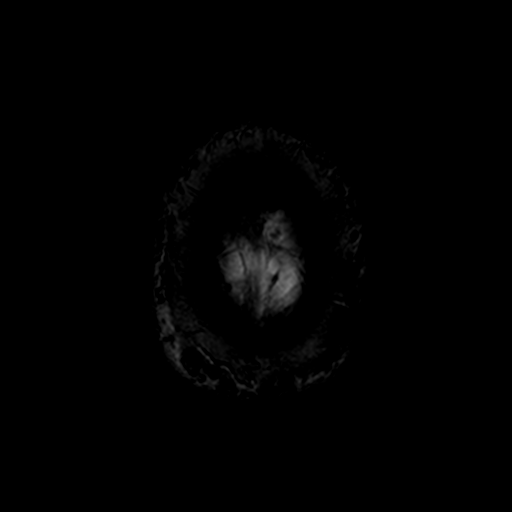

[46 of 48 positions shown; findings below may reference images not displayed]

FINDINGS: Cerebral and cerebellar volume are normal.
Minimal small vessel disease. No involvement of the brainstem or cerebellum.
No restricted diffusion, hemorrhage, mass, or mass effect.
Normal gray-white matter signal and differentiation.
The ventricles are normal in size and morphology.
Unremarkable pituitary gland, sella turcica, corpus callosum and brainstem.
Unremarkable posterior fossa and CP angles. No significant cerebellar tonsillar ectopia.
The dominant skull base vascular flow voids are well seen and preserved.
The mastoid cells image normally.
Intact calvarium, skull base, and clivus.
Unremarkable included neck compartments.
No acute sinusitis.
IMPRESSION: Normal study for age demonstrating minimal subcortical white matter disease. No structural lesion or acute intracranial findings.

## 2021-05-08 IMAGING — US US CAROTID DOPPLER BILATERAL
1 series · 14 of 24 positions shown · non-contrast
Comparison: none

FINAL REPORT:
Exam: Bilateral carotid ultrasound
On the right:
CCA 79 cm/s
ICA 124 cm/s
ECA 65 cm/s
Antegrade flow within the vertebral artery
The ICA/CCA radio is
Peak end-diastolic velocity 19 in the CCA, 39 in the ICA. The ICA/CCA ratio is
On the left:
CCA 70 cm/s
ICA 100 cm/s
ECA 138 cm/s
Antegrade flow within vertebral artery
Peak end-diastolic velocity 17 in the CCA, 33 in the ICA. The ICA/CCA ratio is
Mild heterogeneous plaque about the carotid bulbs.

[Series 1: us carotid doppler bilateral · 14 of 69 slices shown]
[im 1/69]
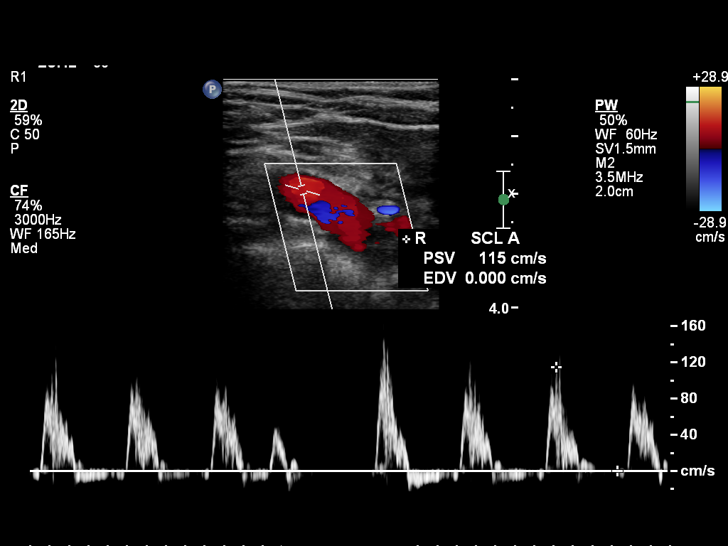
[im 6/69]
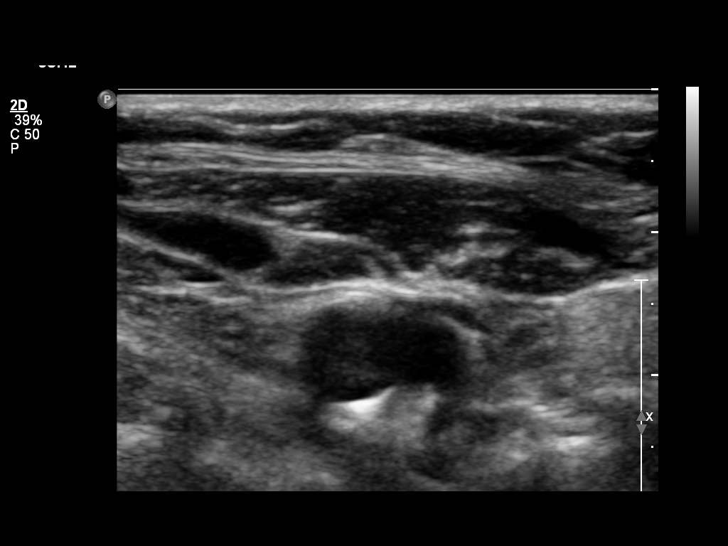
[im 12/69]
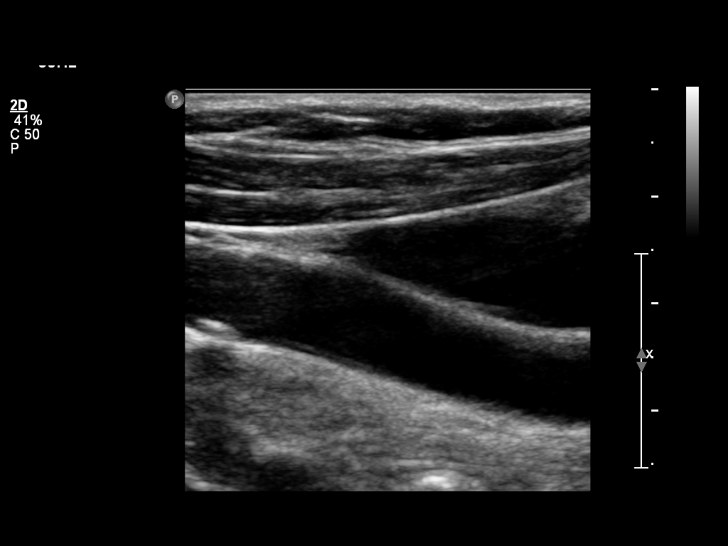
[im 18/69]
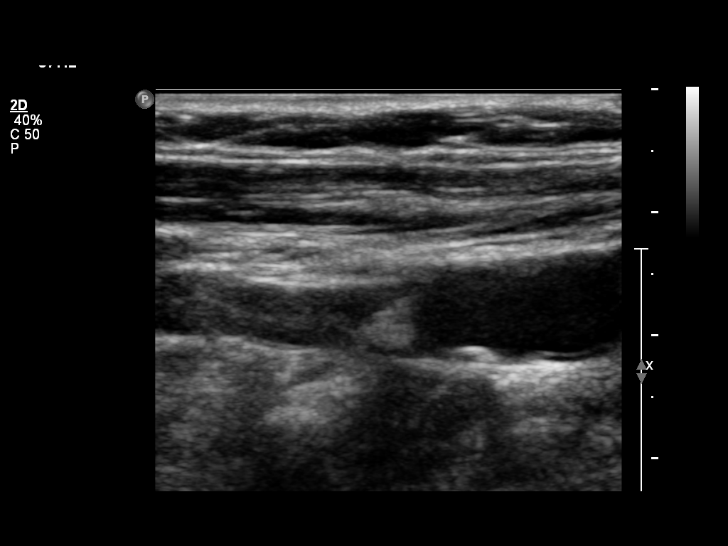
[im 21/69]
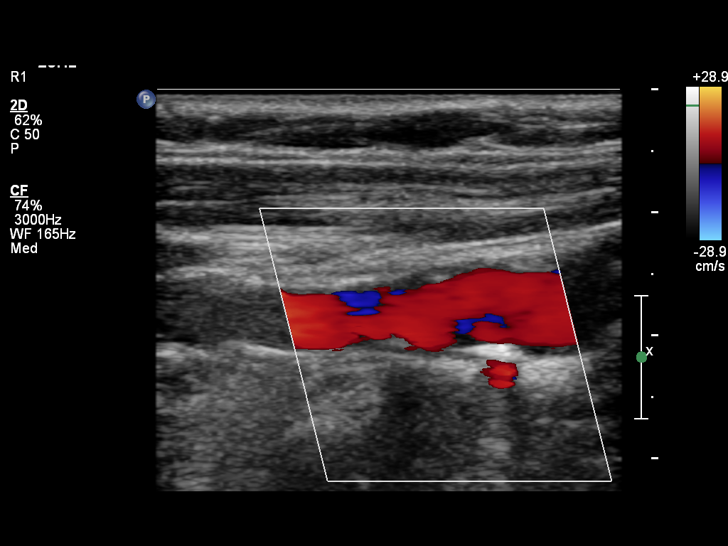
[im 27/69]
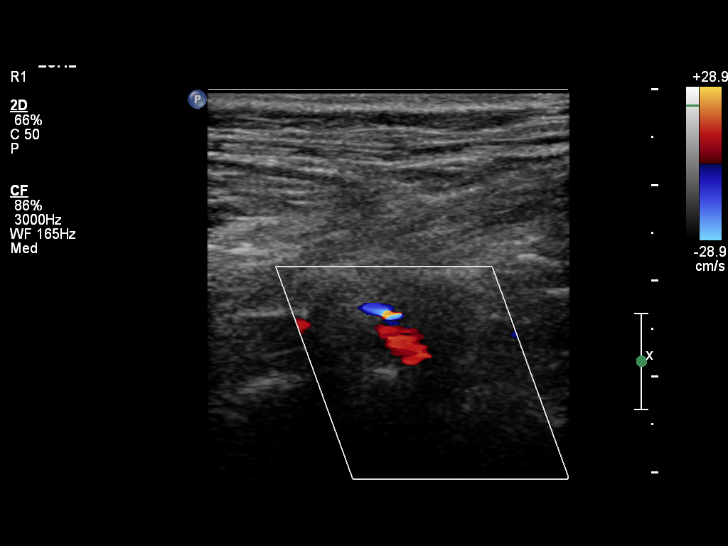
[im 33/69]
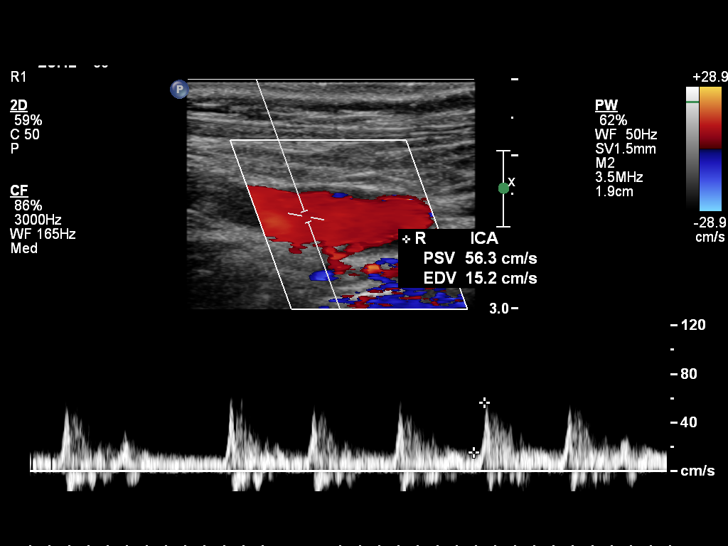
[im 36/69]
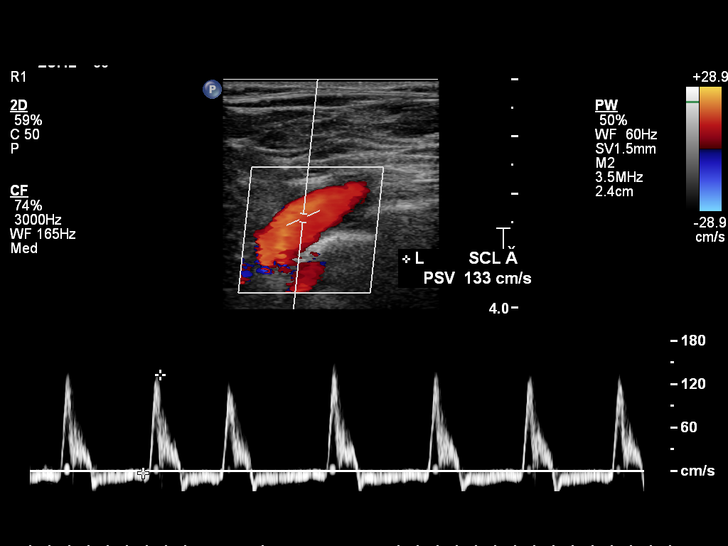
[im 42/69]
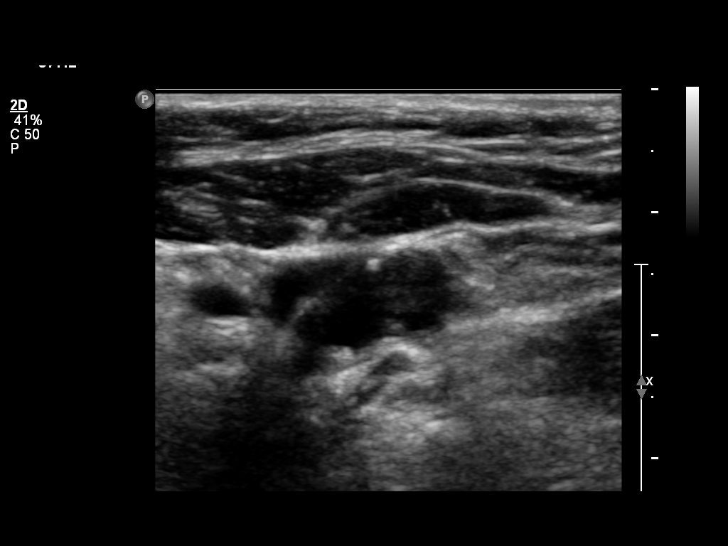
[im 48/69]
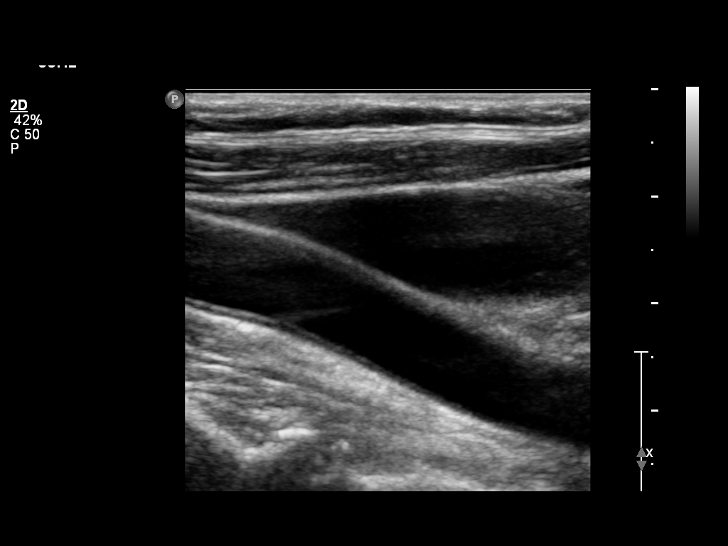
[im 54/69]
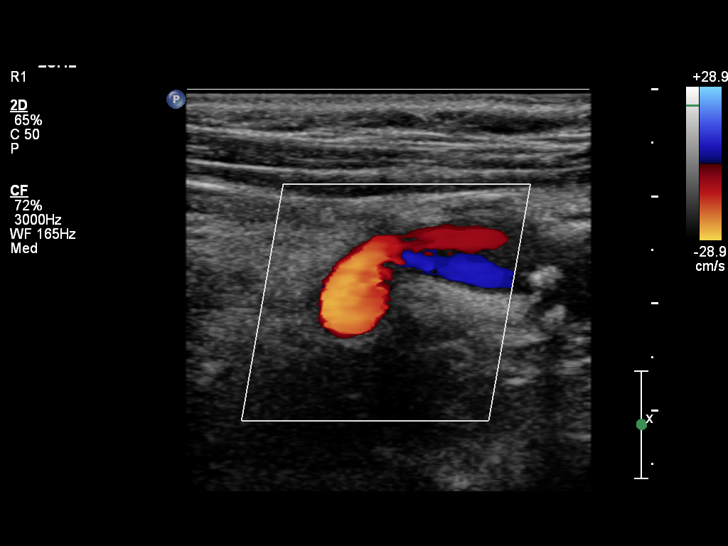
[im 57/69]
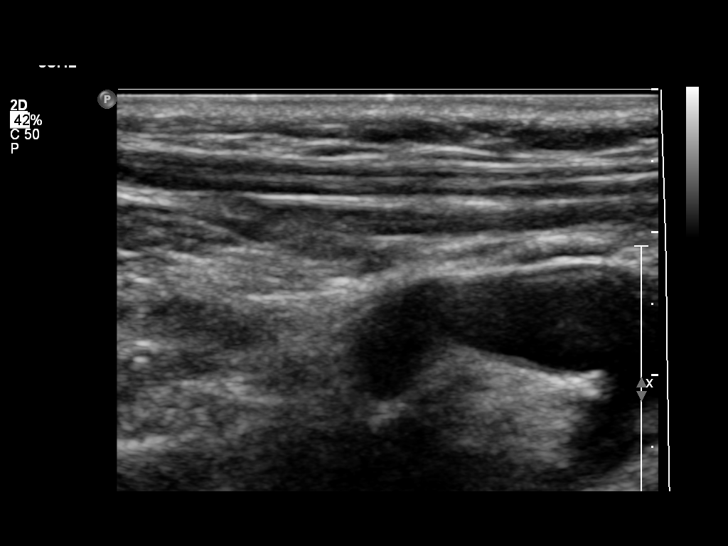
[im 63/69]
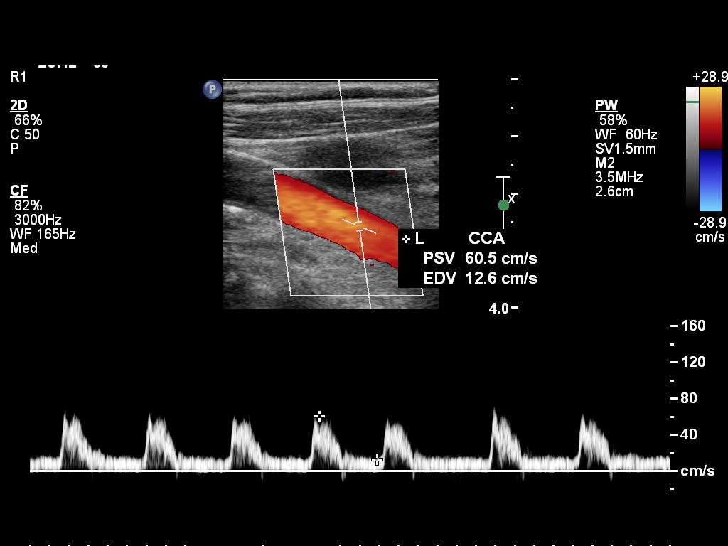
[im 69/69]
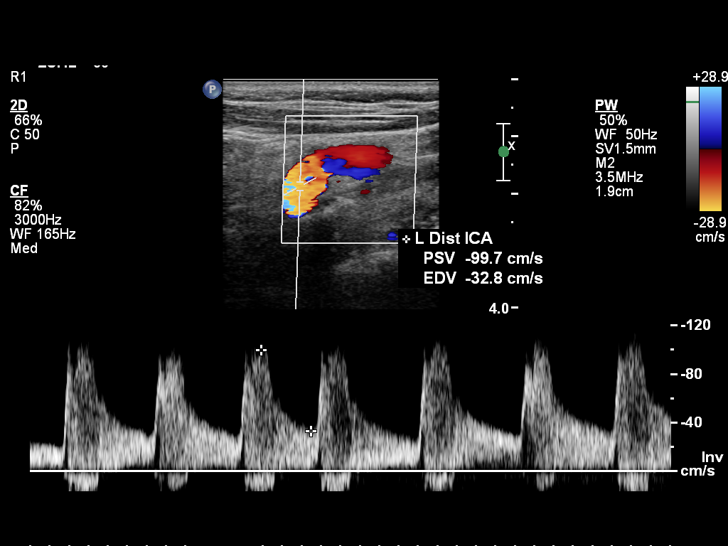

[14 of 24 positions shown; findings below may reference images not displayed]

IMPRESSION: No hemodynamically significant stenosis bilaterally.
Antegrade flow within the vertebral arteries bilaterally.
Portable?
No

## 2021-06-01 IMAGING — MR MRI HEAD ANGIO WITHOUT IV CONTRAST
5 series · 28 of 48 positions shown · non-contrast
Comparison: none

FINAL REPORT:
MRA of the head without contrast
HISTORY: Atherosclerotic heart disease of native coronary artery without angina pectoris
Other fatigue
Unsteadiness on feet
Paresthesia of skin
Diplopia
TECHNIQUE: 3-D time-of-flight MRA images were obtained through the intracranial circulation. Reconstructed MIP images were obtained.

[Series 1: survey · axial · 8.0mm · 0.51mm/px · z∈[-46,+117]mm · 4 of 20 slices shown]
[im 1/20]
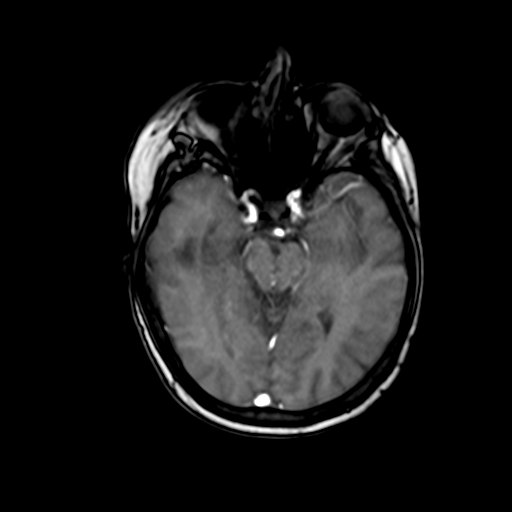
[im 7/20]
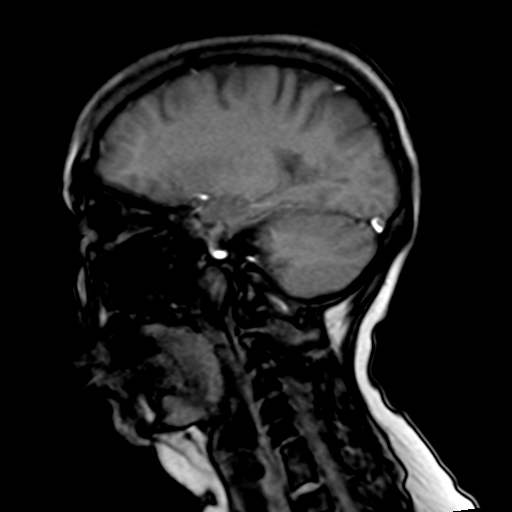
[im 13/20]
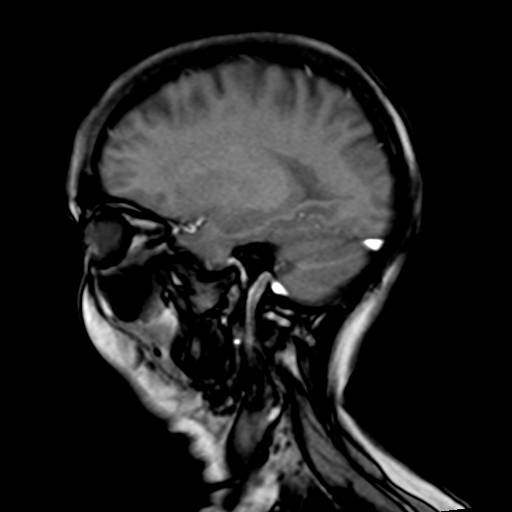
[im 20/20]
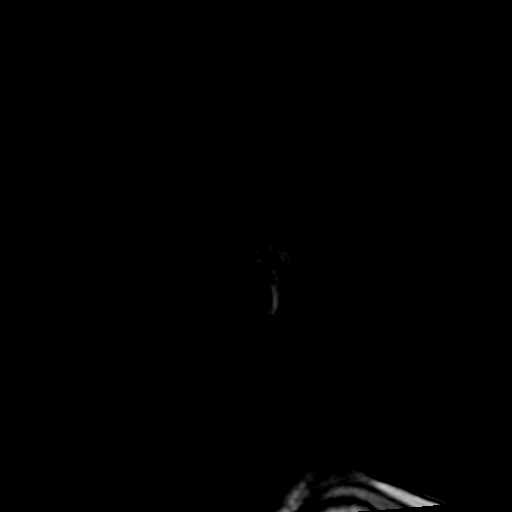

[Series 2: ep2d_diff_(id)_trace_p2_tracew · axial · 4.0mm · 0.60mm/px · z∈[-61,+78]mm · 5 of 30 slices shown]
[im 1/30]
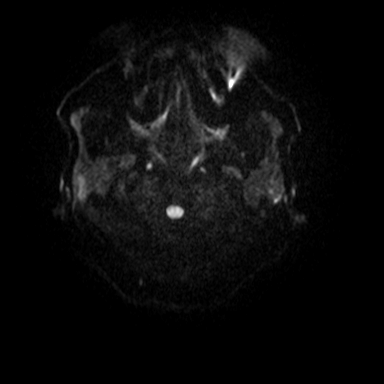
[im 8/30]
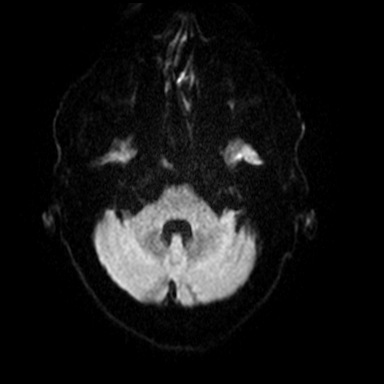
[im 15/30]
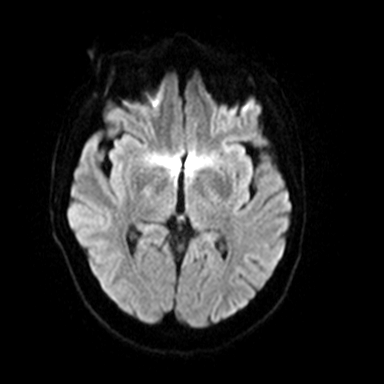
[im 22/30]
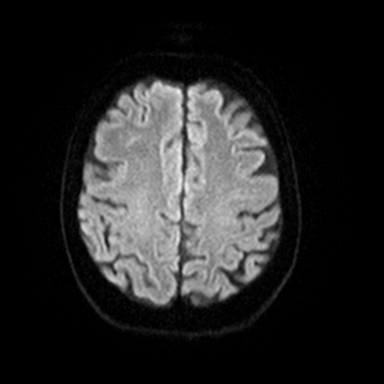
[im 30/30]
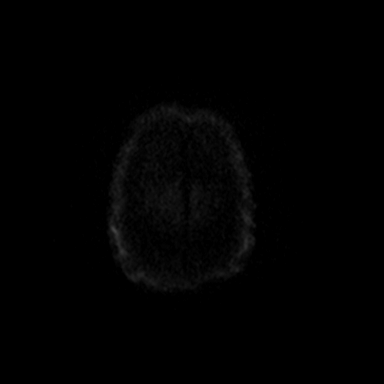

[Series 3: ep2d_diff_(id)_trace_p2_adc · axial · 4.0mm · 0.60mm/px · z∈[-61,+78]mm · 5 of 30 slices shown]
[im 1/30]
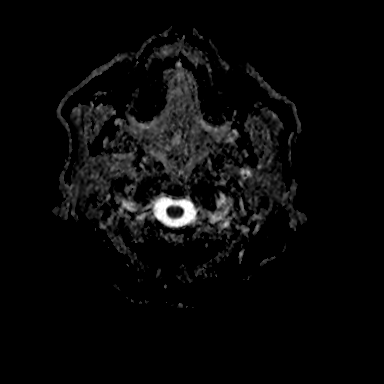
[im 8/30]
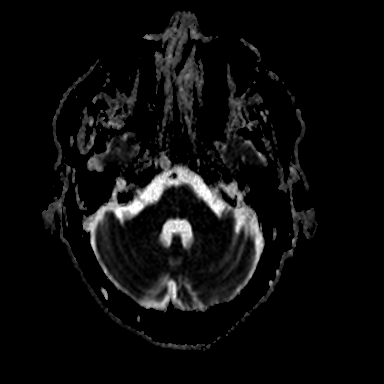
[im 15/30]
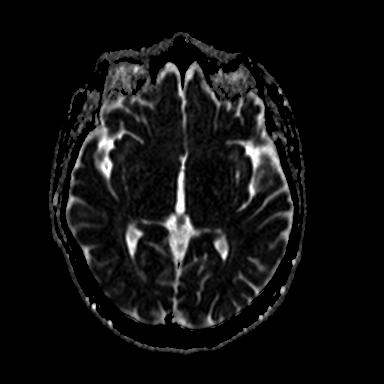
[im 22/30]
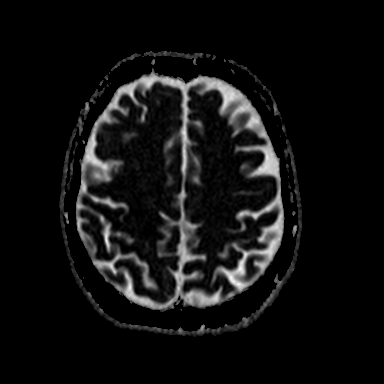
[im 30/30]
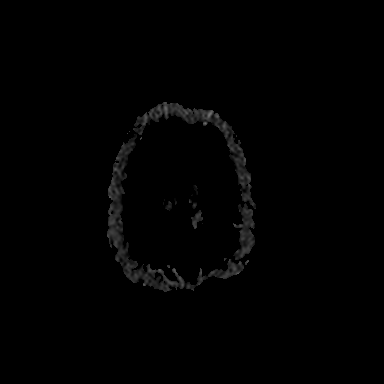

[Series 4: ep2d_diff_(id)_trace_p2_exp · axial · 4.0mm · 0.60mm/px · z∈[-61,+78]mm · 5 of 30 slices shown]
[im 1/30]
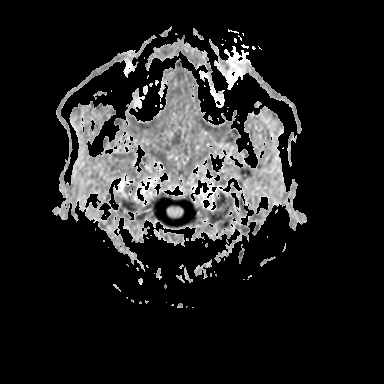
[im 8/30]
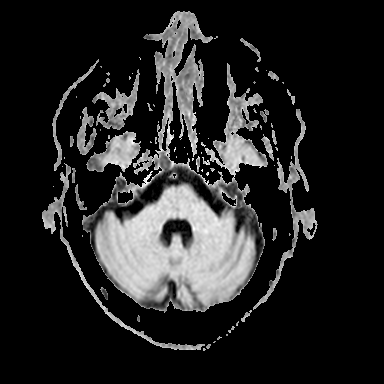
[im 15/30]
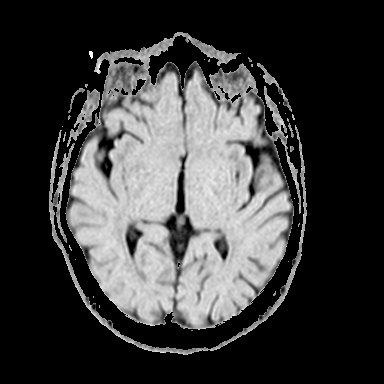
[im 22/30]
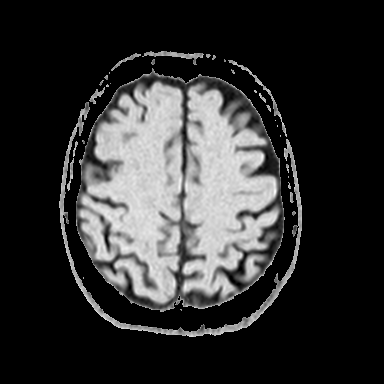
[im 30/30]
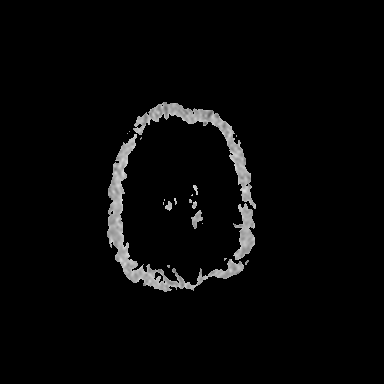

[Series 6: MRA · axial · 0.5mm · 0.39mm/px · z∈[-44,+37]mm · 9 of 168 slices shown]
[im 1/168]
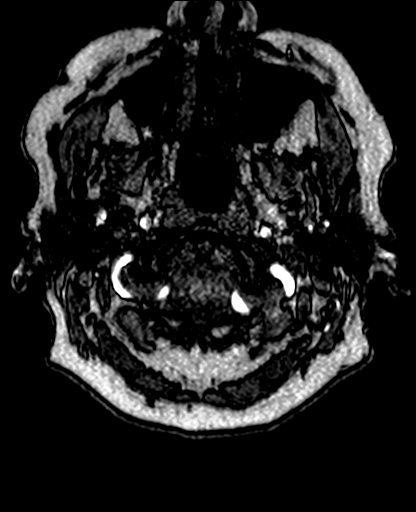
[im 6/168]
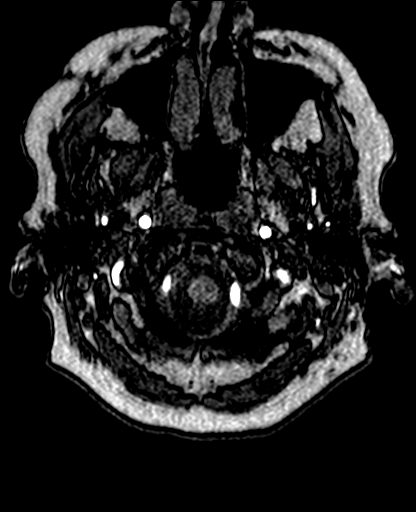
[im 30/168]
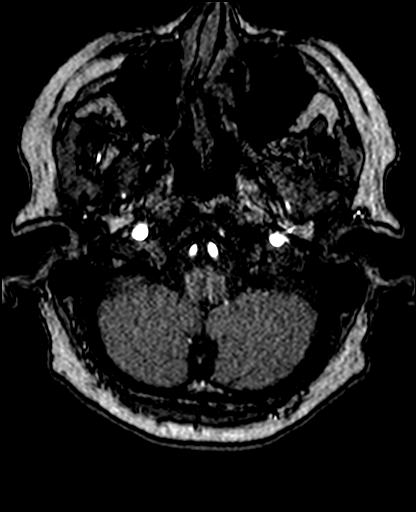
[im 54/168]
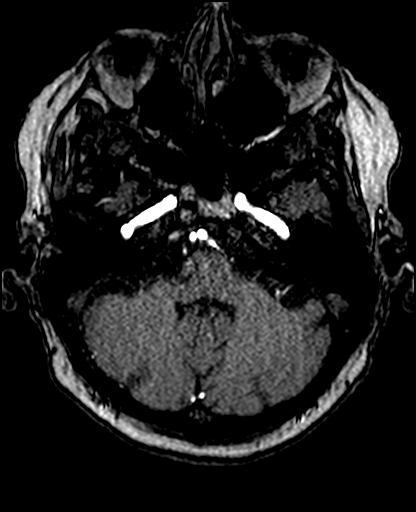
[im 72/168]
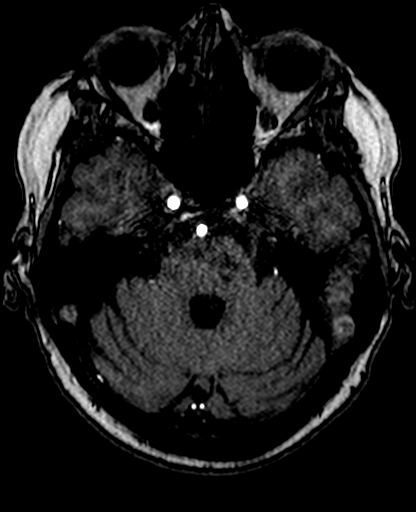
[im 96/168]
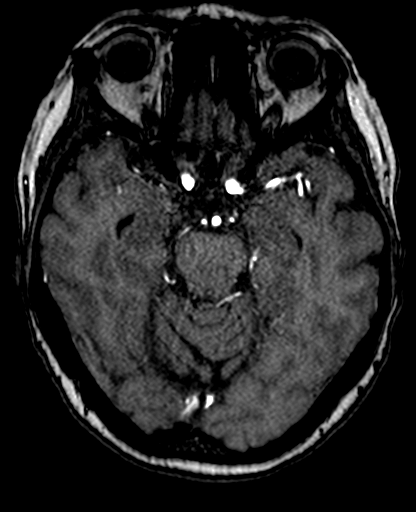
[im 114/168]
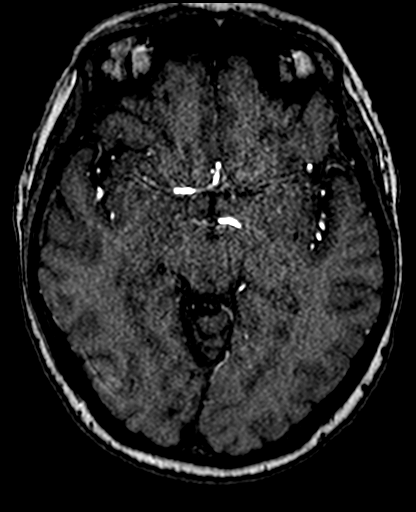
[im 138/168]
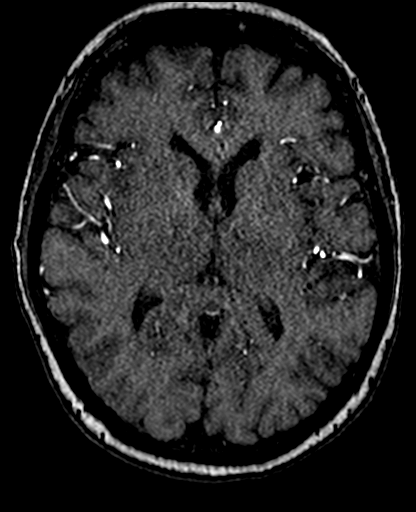
[im 162/168]
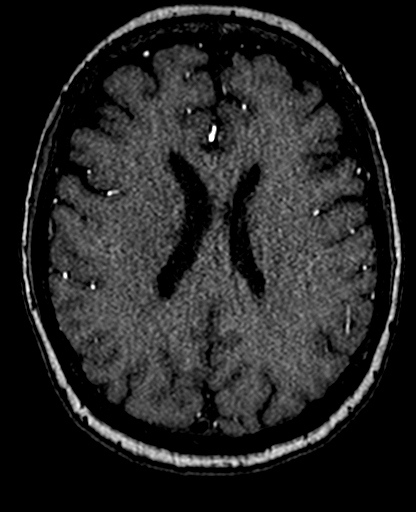

[28 of 48 positions shown; findings below may reference images not displayed]

FINDINGS: The intracranial segments of the internal carotid arteries are widely patent with no hemodynamically significant stenosis or aneurysm. The anterior, middle, and posterior cerebral arteries and their major branches are patent with no hemodynamically significant stenosis or aneurysm.
Basilar artery is widely patent with no hemodynamically significant stenosis or aneurysm.  The intracranial segments of the vertebral arteries are unremarkable.
IMPRESSION: 
IMPRESSION: No hemodynamically significant stenosis or aneurysm within the imaged intracranial circulation.
Stenosis measurements obtained utilizing NASCET criteria.

## 2022-02-08 DEATH — deceased
